# Patient Record
Sex: Female | Born: 1985 | Race: Black or African American | Hispanic: No | Marital: Single | Smoking: Current some day smoker
Health system: Southern US, Community
[De-identification: ages and names within clinical notes are randomized; demographics above are authoritative.]

## PROBLEM LIST (undated history)

## (undated) DIAGNOSIS — Z3490 Encounter for supervision of normal pregnancy, unspecified, unspecified trimester: Secondary | ICD-10-CM

## (undated) DIAGNOSIS — R768 Other specified abnormal immunological findings in serum: Secondary | ICD-10-CM

## (undated) DIAGNOSIS — G35 Multiple sclerosis: Secondary | ICD-10-CM

## (undated) DIAGNOSIS — Z789 Other specified health status: Secondary | ICD-10-CM

## (undated) DIAGNOSIS — F17200 Nicotine dependence, unspecified, uncomplicated: Secondary | ICD-10-CM

## (undated) HISTORY — DX: Other specified abnormal immunological findings in serum: R76.8

## (undated) HISTORY — DX: Multiple sclerosis: G35

## (undated) HISTORY — PX: TONGUE SURGERY: SHX810

## (undated) HISTORY — DX: Nicotine dependence, unspecified, uncomplicated: F17.200

---
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
## 2013-07-20 ENCOUNTER — Encounter (HOSPITAL_COMMUNITY): Payer: Self-pay | Admitting: Emergency Medicine

## 2013-07-20 ENCOUNTER — Emergency Department (HOSPITAL_COMMUNITY)
Admission: EM | Admit: 2013-07-20 | Discharge: 2013-07-20 | Disposition: A | Discharge status: Home / Self Care | Payer: Medicaid Other | Attending: Emergency Medicine | Admitting: Emergency Medicine

## 2013-07-20 DIAGNOSIS — O219 Vomiting of pregnancy, unspecified: Secondary | ICD-10-CM

## 2013-07-20 DIAGNOSIS — R52 Pain, unspecified: Secondary | ICD-10-CM | POA: Insufficient documentation

## 2013-07-20 DIAGNOSIS — O99330 Smoking (tobacco) complicating pregnancy, unspecified trimester: Secondary | ICD-10-CM | POA: Insufficient documentation

## 2013-07-20 DIAGNOSIS — IMO0001 Reserved for inherently not codable concepts without codable children: Secondary | ICD-10-CM | POA: Insufficient documentation

## 2013-07-20 DIAGNOSIS — J069 Acute upper respiratory infection, unspecified: Secondary | ICD-10-CM | POA: Insufficient documentation

## 2013-07-20 DIAGNOSIS — O210 Mild hyperemesis gravidarum: Secondary | ICD-10-CM | POA: Insufficient documentation

## 2013-07-20 DIAGNOSIS — O9989 Other specified diseases and conditions complicating pregnancy, childbirth and the puerperium: Secondary | ICD-10-CM | POA: Insufficient documentation

## 2013-07-20 HISTORY — DX: Encounter for supervision of normal pregnancy, unspecified, unspecified trimester: Z34.90

## 2013-07-20 MED ORDER — PROMETHAZINE HCL 12.5 MG PO TABS
25.0000 mg | ORAL_TABLET | Freq: Once | ORAL | Status: AC
  Administered 2013-07-20: 25 mg via ORAL
  Filled 2013-07-20: qty 2

## 2013-07-20 MED ORDER — PROMETHAZINE HCL 12.5 MG PO TABS: 25.0000 mg | ORAL_TABLET | Freq: Four times a day (QID) | ORAL | Status: DC | PRN

## 2013-07-20 NOTE — ED Notes (Signed)
Pt c/o sore throat and general body aches that started today. Has taken no meds. She took a homoe pregnancy test that was positive. Thinks she may be [redacted] weeks pregnant and has not taken any meds. [facility].

## 2013-07-20 NOTE — ED Notes (Signed)
Pt drinking red soda in triage.

## 2013-07-20 NOTE — Discharge Instructions (Signed)
Upper Respiratory Infection, Adult °An upper respiratory infection (URI) is also sometimes known as the common cold. The upper respiratory tract includes the nose, sinuses, throat, trachea, and bronchi. Bronchi are the airways leading to the lungs. Most people improve within 1 week, but symptoms can last up to 2 weeks. A residual cough may last even longer.  °CAUSES °Many different viruses can infect the tissues lining the upper respiratory tract. The tissues become irritated and inflamed and often become very moist. Mucus production is also common. A cold is contagious. You can easily spread the virus to others by oral contact. This includes kissing, sharing a glass, coughing, or sneezing. Touching your mouth or nose and then touching a surface, which is then touched by another person, can also spread the virus. °SYMPTOMS  °Symptoms typically develop 1 to 3 days after you come in contact with a cold virus. Symptoms vary from person to person. They may include: °· Runny nose. °· Sneezing. °· Nasal congestion. °· Sinus irritation. °· Sore throat. °· Loss of voice (laryngitis). °· Cough. °· Fatigue. °· Muscle aches. °· Loss of appetite. °· Headache. °· Low-grade fever. °DIAGNOSIS  °You might diagnose your own cold based on familiar symptoms, since most people get a cold 2 to 3 times a year. Your caregiver can confirm this based on your exam. Most importantly, your caregiver can check that your symptoms are not due to another disease such as strep throat, sinusitis, pneumonia, asthma, or epiglottitis. Blood tests, throat tests, and X-rays are not necessary to diagnose a common cold, but they may sometimes be helpful in excluding other more serious diseases. Your caregiver will decide if any further tests are required. °RISKS AND COMPLICATIONS  °You may be at risk for a more severe case of the common cold if you smoke cigarettes, have chronic heart disease (such as heart failure) or lung disease (such as asthma), or if  you have a weakened immune system. The very young and very old are also at risk for more serious infections. Bacterial sinusitis, middle ear infections, and bacterial pneumonia can complicate the common cold. The common cold can worsen asthma and chronic obstructive pulmonary disease (COPD). Sometimes, these complications can require emergency medical care and may be life-threatening. °PREVENTION  °The best way to protect against getting a cold is to practice good hygiene. Avoid oral or hand contact with people with cold symptoms. Wash your hands often if contact occurs. There is no clear evidence that vitamin C, vitamin E, echinacea, or exercise reduces the chance of developing a cold. However, it is always recommended to get plenty of rest and practice good nutrition. °TREATMENT  °Treatment is directed at relieving symptoms. There is no cure. Antibiotics are not effective, because the infection is caused by a virus, not by bacteria. Treatment may include: °· Increased fluid intake. Sports drinks offer valuable electrolytes, sugars, and fluids. °· Breathing heated mist or steam (vaporizer or shower). °· Eating chicken soup or other clear broths, and maintaining good nutrition. °· Getting plenty of rest. °· Using gargles or lozenges for comfort. °· Controlling fevers with ibuprofen or acetaminophen as directed by your caregiver. °· Increasing usage of your inhaler if you have asthma. °Zinc gel and zinc lozenges, taken in the first 24 hours of the common cold, can shorten the duration and lessen the severity of symptoms. Pain medicines may help with fever, muscle aches, and throat pain. A variety of non-prescription medicines are available to treat congestion and runny nose. Your caregiver   can make recommendations and may suggest nasal or lung inhalers for other symptoms.  HOME CARE INSTRUCTIONS   Only take over-the-counter or prescription medicines for pain, discomfort, or fever as directed by your  caregiver.  Use a warm mist humidifier or inhale steam from a shower to increase air moisture. This may keep secretions moist and make it easier to breathe.  Drink enough water and fluids to keep your urine clear or pale yellow.  Rest as needed.  Return to work when your temperature has returned to normal or as your caregiver advises. You may need to stay home longer to avoid infecting others. You can also use a face mask and careful hand washing to prevent spread of the virus. SEEK MEDICAL CARE IF:   After the first few days, you feel you are getting worse rather than better.  You need your caregiver's advice about medicines to control symptoms.  You develop chills, worsening shortness of breath, or brown or red sputum. These may be signs of pneumonia.  You develop yellow or brown nasal discharge or pain in the face, especially when you bend forward. These may be signs of sinusitis.  You develop a fever, swollen neck glands, pain with swallowing, or white areas in the back of your throat. These may be signs of strep throat. SEEK IMMEDIATE MEDICAL CARE IF:   You have a fever.  You develop severe or persistent headache, ear pain, sinus pain, or chest pain.  You develop wheezing, a prolonged cough, cough up blood, or have a change in your usual mucus (if you have chronic lung disease).  You develop sore muscles or a stiff neck. Document Released: 12/25/2000 Document Revised: 09/23/2011 Document Reviewed: 11/02/2010 [facility] Patient Information 2014 [place], [place].  Morning Sickness Morning sickness is when you feel sick to your stomach (nauseous) during pregnancy. This nauseous feeling may or may not come with vomiting. It often occurs in the morning but can be a problem any time of day. Morning sickness is most common during the first trimester, but it may continue throughout pregnancy. While morning sickness is unpleasant, it is usually harmless unless you develop severe and  continual vomiting (hyperemesis gravidarum). This condition requires more intense treatment.  CAUSES  The cause of morning sickness is not completely known but seems to be related to normal hormonal changes that occur in pregnancy. RISK FACTORS You are at greater risk if you:  Experienced nausea or vomiting before your pregnancy.  Had morning sickness during a previous pregnancy.  Are pregnant with more than one baby, such as twins. TREATMENT  Do not use any medicines (prescription, over-the-counter, or herbal) for morning sickness without first talking to your health care provider. Your health care provider may prescribe or recommend:  Vitamin B6 supplements.  Anti-nausea medicines.  The herbal medicine ginger. HOME CARE INSTRUCTIONS   Only take over-the-counter or prescription medicines as directed by your health care provider.  Taking multivitamins before getting pregnant can prevent or decrease the severity of morning sickness in most women.   Eat a piece of dry toast or unsalted crackers before getting out of bed in the morning.   Eat five or six small meals a day.   Eat dry and bland foods (rice, baked potato). Foods high in carbohydrates are often helpful.  Do not drink liquids with your meals. Drink liquids between meals.   Avoid greasy, fatty, and spicy foods.   Get someone to cook for you if the smell of any food causes  nausea and vomiting.   If you feel nauseous after taking prenatal vitamins, take the vitamins at night or with a snack.  Snack on protein foods (nuts, yogurt, cheese) between meals if you are hungry.   Eat unsweetened gelatins for desserts.   Wearing an acupressure wristband (worn for sea sickness) may be helpful.   Acupuncture may be helpful.   Do not smoke.   Get a humidifier to keep the air in your house free of odors.   Get plenty of fresh air. SEEK MEDICAL CARE IF:   Your home remedies are not working, and you need  medicine.  You feel dizzy or lightheaded.  You are losing weight. SEEK IMMEDIATE MEDICAL CARE IF:   You have persistent and uncontrolled nausea and vomiting.  You pass out (faint). Document Released: 08/22/2006 Document Revised: 03/03/2013 Document Reviewed: 12/16/2012 [facility] Patient Information 2014 [place], [place].

## 2013-07-20 NOTE — ED Provider Notes (Signed)
Medical screening examination/treatment/procedure(s) were performed by non-physician practitioner and as supervising physician I was immediately available for consultation/collaboration.  EKG Interpretation   None         Shelda JakesScott W. Dorine Duffey, MD 07/20/13 2117

## 2013-07-20 NOTE — ED Provider Notes (Signed)
CSN: [id number]     Arrival date & time 07/20/13  1515 History   First MD Initiated Contact with Patient 07/20/13 1654     Chief Complaint  Patient presents with  . Sore Throat  . Generalized Body Aches   (Consider location/radiation/quality/duration/timing/severity/associated sxs/prior Treatment) HPI Comments: Sherri Woodward is a 28 y.o. Female presenting with a one day history generalized body aches,  Mild dry cough and sore throat (yesterday, but better today).  She also has daily nausea for the past few weeks, is newly pregnant per home pregnancy test and confirmation by the health department and [facility]  Will be [facility] in 6 days.  She is approximately [redacted] weeks pregnant and reports she is eating a drinking, but reduced. She did have one episode of vomiting this am.  She denies abdominal pain.  She has taken no medicines prior to arrival. She has not had a flu vaccine. She is taking prenatal vitamins.     The history is provided by the patient.    Past Medical History  Diagnosis Date  . Pregnancy    Past Surgical History  Procedure Laterality Date  . Cesarean section     No family history on file. History  Substance Use Topics  . Smoking status: Current Every Day Smoker    Types: Cigarettes  . Smokeless tobacco: Not on file  . Alcohol Use: No   OB History   Grav Para Term Preterm Abortions TAB SAB Ect Mult Living   1              Review of Systems  Constitutional: Negative for fever and chills.  HENT: Positive for congestion, rhinorrhea and sore throat. Negative for ear pain, sinus pressure, trouble swallowing and voice change.   Eyes: Negative for discharge.  Respiratory: Positive for cough. Negative for shortness of breath, wheezing and stridor.   Cardiovascular: Negative for chest pain.  Gastrointestinal: Positive for nausea. Negative for abdominal pain.  Genitourinary: Negative.   Musculoskeletal: Positive for myalgias.     Allergies  Review of patient's allergies indicates no known allergies.  Home Medications   Current Outpatient Rx  Name  Route  Sig  Dispense  Refill  . Prenatal Vit-Fe Fumarate-FA (PRENATAL MULTIVITAMIN) TABS tablet   Oral   Take 1 tablet by mouth daily at 12 noon.         . promethazine (PHENERGAN) 12.5 MG tablet   Oral   Take 2 tablets (25 mg total) by mouth every 6 (six) hours as needed for nausea or vomiting.   20 tablet   0    BP 114/61  Pulse 88  Temp(Src) 99.1 F (37.3 C) (Oral)  Resp 20  Ht 5\' 4"  (1.626 m)  Wt 242 lb (109.77 kg)  BMI 41.52 kg/m2  SpO2 100%  LMP 05/28/2013 Physical Exam  Constitutional: She is oriented to person, place, and time. She appears well-developed and well-nourished.  HENT:  Head: Normocephalic and atraumatic.  Right Ear: Tympanic membrane and ear canal normal.  Left Ear: Tympanic membrane and ear canal normal.  Nose: Rhinorrhea present. No mucosal edema.  Mouth/Throat: Uvula is midline, oropharynx is clear and moist and mucous membranes are normal. No oropharyngeal exudate, posterior oropharyngeal edema, posterior oropharyngeal erythema or tonsillar abscesses.  Eyes: Conjunctivae are normal.  Cardiovascular: Normal rate, regular rhythm and normal heart sounds.   Pulmonary/Chest: Effort normal. No respiratory distress. She has no decreased breath sounds. She has no wheezes.  She has no rhonchi. She has no rales.  Abdominal: Soft. There is no tenderness. There is no guarding.  Musculoskeletal: Normal range of motion.  Neurological: She is alert and oriented to person, place, and time.  Skin: Skin is warm and dry. No rash noted.  Psychiatric: She has a normal mood and affect.    ED Course  Procedures (including critical care time) Labs Review Labs Reviewed - No data to display Imaging Review No results found.  EKG Interpretation   None       MDM   1. Acute URI   2. Nausea/vomiting in pregnancy    Pt was prescribed  phenergan for nausea,  Advised rest,  Increased fluid intake.  F/u with ob next week as planned.  Her exam is reassuring,  She is not clinically dehydrated.  Exam c/w viral uri/ possible influenza,  But is afebrile, suspect uri complicated by nausea and fatigue of early pregnancy. Discussed smoking cessation.    Burgess Amor, PA-C 07/20/13 1749

## 2013-07-21 ENCOUNTER — Other Ambulatory Visit: Payer: Self-pay | Admitting: Obstetrics & Gynecology

## 2013-07-21 DIAGNOSIS — O3680X0 Pregnancy with inconclusive fetal viability, not applicable or unspecified: Secondary | ICD-10-CM

## 2013-07-26 ENCOUNTER — Encounter: Payer: Self-pay | Admitting: Women's Health

## 2013-07-26 ENCOUNTER — Other Ambulatory Visit: Payer: Self-pay | Admitting: Obstetrics & Gynecology

## 2013-07-26 ENCOUNTER — Ambulatory Visit (INDEPENDENT_AMBULATORY_CARE_PROVIDER_SITE_OTHER): Payer: Medicaid Other

## 2013-07-26 DIAGNOSIS — O3680X0 Pregnancy with inconclusive fetal viability, not applicable or unspecified: Secondary | ICD-10-CM

## 2013-07-26 DIAGNOSIS — O34219 Maternal care for unspecified type scar from previous cesarean delivery: Secondary | ICD-10-CM

## 2013-07-26 NOTE — Progress Notes (Signed)
U/S(8+3wks)-transabdominal u/s performed, single IUP with +FCA noted, FHR-173 bpm, CRL c/w LMP dates, cx appears long and closed, bilateral adnexa appears WNL, no free fluid noted

## 2013-08-02 ENCOUNTER — Encounter: Payer: Self-pay | Admitting: Women's Health

## 2013-08-10 ENCOUNTER — Ambulatory Visit (INDEPENDENT_AMBULATORY_CARE_PROVIDER_SITE_OTHER): Payer: Medicaid Other | Admitting: Women's Health

## 2013-08-10 ENCOUNTER — Other Ambulatory Visit (HOSPITAL_COMMUNITY)
Admission: RE | Admit: 2013-08-10 | Discharge: 2013-08-10 | Disposition: A | Discharge status: Home / Self Care | Payer: Medicaid Other | Source: Ambulatory Visit | Attending: Obstetrics & Gynecology | Admitting: Obstetrics & Gynecology

## 2013-08-10 ENCOUNTER — Encounter: Payer: Self-pay | Admitting: Women's Health

## 2013-08-10 VITALS — BP 114/60 | Ht 63.0 in | Wt 244.0 lb

## 2013-08-10 DIAGNOSIS — Z98891 History of uterine scar from previous surgery: Secondary | ICD-10-CM

## 2013-08-10 DIAGNOSIS — F1920 Other psychoactive substance dependence, uncomplicated: Secondary | ICD-10-CM

## 2013-08-10 DIAGNOSIS — Z1389 Encounter for screening for other disorder: Secondary | ICD-10-CM

## 2013-08-10 DIAGNOSIS — O99320 Drug use complicating pregnancy, unspecified trimester: Secondary | ICD-10-CM

## 2013-08-10 DIAGNOSIS — F17200 Nicotine dependence, unspecified, uncomplicated: Secondary | ICD-10-CM

## 2013-08-10 DIAGNOSIS — Z113 Encounter for screening for infections with a predominantly sexual mode of transmission: Secondary | ICD-10-CM | POA: Insufficient documentation

## 2013-08-10 DIAGNOSIS — Z331 Pregnant state, incidental: Secondary | ICD-10-CM

## 2013-08-10 DIAGNOSIS — Z01419 Encounter for gynecological examination (general) (routine) without abnormal findings: Secondary | ICD-10-CM | POA: Insufficient documentation

## 2013-08-10 DIAGNOSIS — O34219 Maternal care for unspecified type scar from previous cesarean delivery: Secondary | ICD-10-CM

## 2013-08-10 DIAGNOSIS — Z3480 Encounter for supervision of other normal pregnancy, unspecified trimester: Secondary | ICD-10-CM

## 2013-08-10 HISTORY — DX: Nicotine dependence, unspecified, uncomplicated: F17.200

## 2013-08-10 LAB — POCT URINALYSIS DIPSTICK
GLUCOSE UA: NEGATIVE
KETONES UA: NEGATIVE
Leukocytes, UA: NEGATIVE
Nitrite, UA: NEGATIVE
Protein, UA: NEGATIVE
RBC UA: NEGATIVE

## 2013-08-10 NOTE — Progress Notes (Signed)
  Subjective:    Sherri Woodward is a 28 y.o. G73P1001 African American female at [redacted]w[redacted]d by LMP which correlates exactly w/ [redacted]w[redacted]d u/s, being seen today for her first obstetrical visit.  Her obstetrical history is significant for obesity, smoker and 38wk c/s in 2008 d/t FTP @ 4cm.  Pregnancy history fully reviewed.   Patient reports no complaints. Denies n/v, vb, cramping, uti s/s, abnormal/malodorous vag d/c, or vulvovaginal itching/irritation.  Filed Vitals:   08/10/13 1531  BP: 114/60  Weight: 244 lb (110.678 kg)    HISTORY: OB History  Gravida Para Term Preterm AB SAB TAB Ectopic Multiple Living  2 1 1       1     # Outcome Date GA Lbr Len/2nd Weight Sex Delivery Anes PTL Lv  2 CUR           1 TRM 2008 [redacted]w[redacted]d  6 lb 10 oz (3.005 kg) F LTCS EPI N Y     Comments: FTP @ 4cm     Past Medical History  Diagnosis Date  . Pregnancy   . Smoker 08/10/2013   Past Surgical History  Procedure Laterality Date  . Cesarean section     Family History  Problem Relation Age of Onset  . Hypertension Mother   . Stroke Father   . Heart Problems Father      Exam    Pelvic Exam:    Perineum: Normal Perineum   Vulva: normal   Vagina:  normal mucosa, normal discharge, no palpable nodules   Uterus Normal size/shape/contour for GA     Cervix: normal   Adnexa: Not palpable   Urinary: urethral meatus normal    System:     Skin: normal coloration and turgor, no rashes    Neurologic: oriented, normal mood   Extremities: normal strength, tone, and muscle mass   HEENT PERRLA   Mouth/Teeth mucous membranes moist   Cardiovascular: regular rate and rhythm   Respiratory:  appears well, vitals normal, no respiratory distress, acyanotic, normal RR   Abdomen: soft, non-tender    Thin prep pap smear obtained  FHR: 165 via informal transabdominal u/s   Assessment:    Pregnancy: G2P1001 Patient Active Problem List   Diagnosis Date Noted  . Supervision of other normal pregnancy 08/10/2013     Priority: High  . Previous cesarean section 08/10/2013    Priority: High  . Smoker 08/10/2013    Priority: High      [redacted]w[redacted]d G2P1001 New OB visit Smoker Prev c/s    Plan:     Initial labs drawn Continue prenatal vitamins Problem list reviewed and updated Reviewed n/v relief measures and warning s/s to report Reviewed recommended weight gain based on pre-gravid BMI Encouraged well-balanced diet Genetic Screening discussed Integrated Screen: requested Cystic fibrosis screening discussed requested Ultrasound discussed; fetal survey: requested Follow up in 2 weeks for 1st IT/NT and visit To continue decreasing smoking w/ goal of cessation Discussed option of TOLAC Recommended flu shot CCNC completed  Marge Duncans 08/10/2013 4:34 PM

## 2013-08-10 NOTE — Patient Instructions (Signed)
Nausea & Vomiting  Have saltine crackers or pretzels by your bed and eat a few bites before you raise your head out of bed in the morning  Eat small frequent meals throughout the day instead of large meals  Drink plenty of fluids throughout the day to stay hydrated, just don't drink a lot of fluids with your meals.  This can make your stomach fill up faster making you feel sick  Do not brush your teeth right after you eat  Products with real ginger are good for nausea, like ginger ale and ginger hard candy Make sure it says made with real ginger!  Sucking on sour candy like lemon heads is also good for nausea  If your prenatal vitamins make you nauseated, take them at night so you will sleep through the nausea  If you feel like you need medicine for the nausea & vomiting please let us know  If you are unable to keep any fluids or food down please let us know    Pregnancy - First Trimester During sexual intercourse, millions of sperm go into the vagina. Only 1 sperm will penetrate and fertilize the female egg while it is in the Fallopian tube. One week later, the fertilized egg implants into the wall of the uterus. An embryo begins to develop into a baby. At 6 to 8 weeks, the eyes and face are formed and the heartbeat can be seen on ultrasound. At the end of 12 weeks (first trimester), all the baby's organs are formed. Now that you are pregnant, you will want to do everything you can to have a healthy baby. Two of the most important things are to get good prenatal care and follow your caregiver's instructions. Prenatal care is all the medical care you receive before the baby's birth. It is given to prevent, find, and treat problems during the pregnancy and childbirth. PRENATAL EXAMS  During prenatal visits, your weight, blood pressure, and urine are checked. This is done to make sure you are healthy and progressing normally during the pregnancy.  A pregnant woman should gain 25 to 35 pounds  during the pregnancy. However, if you are overweight or underweight, your caregiver will advise you regarding your weight.  Your caregiver will ask and answer questions for you.  Blood work, cervical cultures, other necessary tests, and a Pap test are done during your prenatal exams. These tests are done to check on your health and the probable health of your baby. Tests are strongly recommended and done for HIV with your permission. This is the virus that causes AIDS. These tests are done because medicines can be given to help prevent your baby from being born with this infection should you have been infected without knowing it. Blood work is also used to find out your blood type, previous infections, and follow your blood levels (hemoglobin).  Low hemoglobin (anemia) is common during pregnancy. Iron and vitamins are given to help prevent this. Later in the pregnancy, blood tests for diabetes will be done along with any other tests if any problems develop.  You may need other tests to make sure you and the baby are doing well. CHANGES DURING THE FIRST TRIMESTER  Your body goes through many changes during pregnancy. They vary from person to person. Talk to your caregiver about changes you notice and are concerned about. Changes can include:  Your menstrual period stops.  The egg and sperm carry the genes that determine what you look like. Genes from you   and your partner are forming a baby. The female genes determine whether the baby is a boy or a girl.  Your body increases in girth and you may feel bloated.  Feeling sick to your stomach (nauseous) and throwing up (vomiting). If the vomiting is uncontrollable, call your caregiver.  Your breasts will begin to enlarge and become tender.  Your nipples may stick out more and become darker.  The need to urinate more. Painful urination may mean you have a bladder infection.  Tiring easily.  Loss of appetite.  Cravings for certain kinds of  food.  At first, you may gain or lose a couple of pounds.  You may have changes in your emotions from day to day (excited to be pregnant or concerned something may go wrong with the pregnancy and baby).  You may have more vivid and strange dreams. HOME CARE INSTRUCTIONS   It is very important to avoid all smoking, alcohol and non-prescribed drugs during your pregnancy. These affect the formation and growth of the baby. Avoid chemicals while pregnant to ensure the delivery of a healthy infant.  Start your prenatal visits by the 12th week of pregnancy. They are usually scheduled monthly at first, then more often in the last 2 months before delivery. Keep your caregiver's appointments. Follow your caregiver's instructions regarding medicine use, blood and lab tests, exercise, and diet.  During pregnancy, you are providing food for you and your baby. Eat regular, well-balanced meals. Choose foods such as meat, fish, milk and other low fat dairy products, vegetables, fruits, and whole-grain breads and cereals. Your caregiver will tell you of the ideal weight gain.  You can help morning sickness by keeping soda crackers at the bedside. Eat a couple before arising in the morning. You may want to use the crackers without salt on them.  Eating 4 to 5 small meals rather than 3 large meals a day also may help the nausea and vomiting.  Drinking liquids between meals instead of during meals also seems to help nausea and vomiting.  A physical sexual relationship may be continued throughout pregnancy if there are no other problems. Problems may be early (premature) leaking of amniotic fluid from the membranes, vaginal bleeding, or belly (abdominal) pain.  Exercise regularly if there are no restrictions. Check with your caregiver or physical therapist if you are unsure of the safety of some of your exercises. Greater weight gain will occur in the last 2 trimesters of pregnancy. Exercising will  help:  Control your weight.  Keep you in shape.  Prepare you for labor and delivery.  Help you lose your pregnancy weight after you deliver your baby.  Wear a good support or jogging bra for breast tenderness during pregnancy. This may help if worn during sleep too.  Ask when prenatal classes are available. Begin classes when they are offered.  Do not use hot tubs, steam rooms, or saunas.  Wear your seat belt when driving. This protects you and your baby if you are in an accident.  Avoid raw meat, uncooked cheese, cat litter boxes, and soil used by cats throughout the pregnancy. These carry germs that can cause birth defects in the baby.  The first trimester is a good time to visit your dentist for your dental health. Getting your teeth cleaned is okay. Use a softer toothbrush and brush gently during pregnancy.  Ask for help if you have financial, counseling, or nutritional needs during pregnancy. Your caregiver will be able to offer counseling for   these needs as well as refer you for other special needs.  Do not take any medicines or herbs unless told by your caregiver.  Inform your caregiver if there is any mental or physical domestic violence.  Make a list of emergency phone numbers of family, friends, hospital, and police and fire departments.  Write down your questions. Take them to your prenatal visit.  Do not douche.  Do not cross your legs.  If you have to stand for long periods of time, rotate you feet or take small steps in a circle.  You may have more vaginal secretions that may require a sanitary pad. Do not use tampons or scented sanitary pads. MEDICINES AND DRUG USE IN PREGNANCY  Take prenatal vitamins as directed. The vitamin should contain 1 milligram of folic acid. Keep all vitamins out of reach of children. Only a couple vitamins or tablets containing iron may be fatal to a baby or young child when ingested.  Avoid use of all medicines, including herbs,  over-the-counter medicines, not prescribed or suggested by your caregiver. Only take over-the-counter or prescription medicines for pain, discomfort, or fever as directed by your caregiver. Do not use aspirin, ibuprofen, or naproxen unless directed by your caregiver.  Let your caregiver also know about herbs you may be using.  Alcohol is related to a number of birth defects. This includes fetal alcohol syndrome. All alcohol, in any form, should be avoided completely. Smoking will cause low birth rate and premature babies.  Street or illegal drugs are very harmful to the baby. They are absolutely forbidden. A baby born to an addicted mother will be addicted at birth. The baby will go through the same withdrawal an adult does.  Let your caregiver know about any medicines that you have to take and for what reason you take them. SEEK MEDICAL CARE IF:  You have any concerns or worries during your pregnancy. It is better to call with your questions if you feel they cannot wait, rather than worry about them. SEEK IMMEDIATE MEDICAL CARE IF:   An unexplained oral temperature above 102 F (38.9 C) develops, or as your caregiver suggests.  You have leaking of fluid from the vagina (birth canal). If leaking membranes are suspected, take your temperature and inform your caregiver of this when you call.  There is vaginal spotting or bleeding. Notify your caregiver of the amount and how many pads are used.  You develop a bad smelling vaginal discharge with a change in the color.  You continue to feel sick to your stomach (nauseated) and have no relief from remedies suggested. You vomit blood or coffee ground-like materials.  You lose more than 2 pounds of weight in 1 week.  You gain more than 2 pounds of weight in 1 week and you notice swelling of your face, hands, feet, or legs.  You gain 5 pounds or more in 1 week (even if you do not have swelling of your hands, face, legs, or feet).  You get  exposed to German measles and have never had them.  You are exposed to fifth disease or chickenpox.  You develop belly (abdominal) pain. Round ligament discomfort is a common non-cancerous (benign) cause of abdominal pain in pregnancy. Your caregiver still must evaluate this.  You develop headache, fever, diarrhea, pain with urination, or shortness of breath.  You fall or are in a car accident or have any kind of trauma.  There is mental or physical violence in your home. Document   Released: 06/25/2001 Document Revised: 03/25/2012 Document Reviewed: 12/27/2008 [employer] Patient Information 2014 [employer].  

## 2013-08-11 ENCOUNTER — Encounter: Payer: Self-pay | Admitting: Women's Health

## 2013-08-11 DIAGNOSIS — F1290 Cannabis use, unspecified, uncomplicated: Secondary | ICD-10-CM | POA: Insufficient documentation

## 2013-08-11 LAB — DRUG SCREEN, URINE, NO CONFIRMATION
AMPHETAMINE SCRN UR: NEGATIVE
BENZODIAZEPINES.: NEGATIVE
Barbiturate Quant, Ur: NEGATIVE
Cocaine Metabolites: NEGATIVE
Creatinine,U: 140.5 mg/dL
Marijuana Metabolite: POSITIVE — AB
Methadone: NEGATIVE
Opiate Screen, Urine: NEGATIVE
Phencyclidine (PCP): NEGATIVE
Propoxyphene: NEGATIVE

## 2013-08-11 LAB — ABO AND RH: RH TYPE: POSITIVE

## 2013-08-11 LAB — CBC
HEMATOCRIT: 36 % (ref 36.0–46.0)
HEMOGLOBIN: 12.1 g/dL (ref 12.0–15.0)
MCH: 31.4 pg (ref 26.0–34.0)
MCHC: 33.6 g/dL (ref 30.0–36.0)
MCV: 93.5 fL (ref 78.0–100.0)
Platelets: 288 10*3/uL (ref 150–400)
RBC: 3.85 MIL/uL — ABNORMAL LOW (ref 3.87–5.11)
RDW: 13.4 % (ref 11.5–15.5)
WBC: 7.9 10*3/uL (ref 4.0–10.5)

## 2013-08-11 LAB — URINALYSIS
Bilirubin Urine: NEGATIVE
GLUCOSE, UA: NEGATIVE mg/dL
Hgb urine dipstick: NEGATIVE
Ketones, ur: NEGATIVE mg/dL
LEUKOCYTES UA: NEGATIVE
NITRITE: NEGATIVE
PH: 7 (ref 5.0–8.0)
Protein, ur: NEGATIVE mg/dL
Specific Gravity, Urine: 1.025 (ref 1.005–1.030)
UROBILINOGEN UA: 1 mg/dL (ref 0.0–1.0)

## 2013-08-11 LAB — HIV ANTIBODY (ROUTINE TESTING W REFLEX): HIV: NONREACTIVE

## 2013-08-11 LAB — HEPATITIS B SURFACE ANTIGEN: Hepatitis B Surface Ag: NEGATIVE

## 2013-08-11 LAB — RPR

## 2013-08-11 LAB — VARICELLA ZOSTER ANTIBODY, IGG

## 2013-08-11 LAB — OXYCODONE SCREEN, UA, RFLX CONFIRM: Oxycodone Screen, Ur: NEGATIVE ng/mL

## 2013-08-11 LAB — ANTIBODY SCREEN: Antibody Screen: NEGATIVE

## 2013-08-11 LAB — RUBELLA SCREEN: Rubella: 2.84 Index — ABNORMAL HIGH (ref <0.90)

## 2013-08-12 LAB — URINE CULTURE

## 2013-08-13 LAB — CYSTIC FIBROSIS DIAGNOSTIC STUDY

## 2013-08-14 ENCOUNTER — Telehealth: Payer: Self-pay | Admitting: Women's Health

## 2013-08-14 ENCOUNTER — Encounter: Payer: Self-pay | Admitting: Women's Health

## 2013-08-14 DIAGNOSIS — O2340 Unspecified infection of urinary tract in pregnancy, unspecified trimester: Secondary | ICD-10-CM

## 2013-08-14 DIAGNOSIS — B951 Streptococcus, group B, as the cause of diseases classified elsewhere: Secondary | ICD-10-CM

## 2013-08-14 MED ORDER — AMPICILLIN 500 MG PO CAPS: 500.0000 mg | ORAL_CAPSULE | Freq: Four times a day (QID) | ORAL | Status: DC

## 2013-08-14 NOTE — Telephone Encounter (Signed)
Left message for pt to return call to notify her of GBS uti, ampicillin 500 QID x 7d rx'd to her pharmacy.   Cheral Marker, CNM, [facility] 08/14/2013 8:42 AM

## 2013-08-14 NOTE — Telephone Encounter (Signed)
Received call from pt, notified her of gbs uti and rx at her pharmacy.   Cheral MarkerKimberly R. Trinidad Petron, CNM, [facility] 08/14/2013 9:21 AM

## 2013-08-24 ENCOUNTER — Encounter: Payer: Self-pay | Admitting: Advanced Practice Midwife

## 2013-08-24 ENCOUNTER — Encounter (INDEPENDENT_AMBULATORY_CARE_PROVIDER_SITE_OTHER): Payer: Self-pay

## 2013-08-24 ENCOUNTER — Ambulatory Visit (INDEPENDENT_AMBULATORY_CARE_PROVIDER_SITE_OTHER): Payer: Medicaid Other | Admitting: Advanced Practice Midwife

## 2013-08-24 ENCOUNTER — Ambulatory Visit (INDEPENDENT_AMBULATORY_CARE_PROVIDER_SITE_OTHER): Payer: Medicaid Other

## 2013-08-24 ENCOUNTER — Other Ambulatory Visit: Payer: Self-pay | Admitting: Women's Health

## 2013-08-24 ENCOUNTER — Other Ambulatory Visit: Payer: Self-pay | Admitting: Advanced Practice Midwife

## 2013-08-24 VITALS — BP 100/68 | Wt 250.0 lb

## 2013-08-24 DIAGNOSIS — Z36 Encounter for antenatal screening of mother: Secondary | ICD-10-CM

## 2013-08-24 DIAGNOSIS — O34219 Maternal care for unspecified type scar from previous cesarean delivery: Secondary | ICD-10-CM

## 2013-08-24 DIAGNOSIS — Z331 Pregnant state, incidental: Secondary | ICD-10-CM

## 2013-08-24 DIAGNOSIS — Z3480 Encounter for supervision of other normal pregnancy, unspecified trimester: Secondary | ICD-10-CM

## 2013-08-24 DIAGNOSIS — Z1389 Encounter for screening for other disorder: Secondary | ICD-10-CM

## 2013-08-24 DIAGNOSIS — O358XX0 Maternal care for other (suspected) fetal abnormality and damage, not applicable or unspecified: Secondary | ICD-10-CM

## 2013-08-24 DIAGNOSIS — F1920 Other psychoactive substance dependence, uncomplicated: Secondary | ICD-10-CM

## 2013-08-24 DIAGNOSIS — F1290 Cannabis use, unspecified, uncomplicated: Secondary | ICD-10-CM

## 2013-08-24 DIAGNOSIS — O99320 Drug use complicating pregnancy, unspecified trimester: Secondary | ICD-10-CM

## 2013-08-24 LAB — POCT URINALYSIS DIPSTICK
Glucose, UA: NEGATIVE
KETONES UA: NEGATIVE
Leukocytes, UA: NEGATIVE
NITRITE UA: NEGATIVE
Protein, UA: NEGATIVE
RBC UA: NEGATIVE

## 2013-08-24 NOTE — Progress Notes (Signed)
U/S(12+4wks)-single IUP with +FCA noted, FHR- 158 bpm, cx appears closed (3.7cm), bilateral adnexa wnl, CRL c/w dates, NB present, NT-1.32mm, posterior Gr 0 placenta

## 2013-08-24 NOTE — Progress Notes (Signed)
Had NT/IT today.  Counseled about marijuana and weight.   Dry skin on breasts. Hydrocortisione.  Routine questions about pregnancy answered.  F/U in 4 weeks for 2nd IT/Low-risk ob appt .

## 2013-08-28 LAB — MATERNAL SCREEN, INTEGRATED #1

## 2013-09-21 ENCOUNTER — Ambulatory Visit (INDEPENDENT_AMBULATORY_CARE_PROVIDER_SITE_OTHER): Payer: Medicaid Other | Admitting: Obstetrics & Gynecology

## 2013-09-21 ENCOUNTER — Encounter: Payer: Self-pay | Admitting: Obstetrics & Gynecology

## 2013-09-21 VITALS — BP 120/70 | Wt 246.0 lb

## 2013-09-21 DIAGNOSIS — F1920 Other psychoactive substance dependence, uncomplicated: Secondary | ICD-10-CM

## 2013-09-21 DIAGNOSIS — O99320 Drug use complicating pregnancy, unspecified trimester: Secondary | ICD-10-CM

## 2013-09-21 DIAGNOSIS — Z1389 Encounter for screening for other disorder: Secondary | ICD-10-CM

## 2013-09-21 DIAGNOSIS — O358XX0 Maternal care for other (suspected) fetal abnormality and damage, not applicable or unspecified: Secondary | ICD-10-CM

## 2013-09-21 DIAGNOSIS — Z3480 Encounter for supervision of other normal pregnancy, unspecified trimester: Secondary | ICD-10-CM

## 2013-09-21 DIAGNOSIS — O34219 Maternal care for unspecified type scar from previous cesarean delivery: Secondary | ICD-10-CM

## 2013-09-21 DIAGNOSIS — Z331 Pregnant state, incidental: Secondary | ICD-10-CM

## 2013-09-21 LAB — POCT URINALYSIS DIPSTICK
Blood, UA: NEGATIVE
GLUCOSE UA: NEGATIVE
KETONES UA: NEGATIVE
Leukocytes, UA: NEGATIVE
Nitrite, UA: NEGATIVE

## 2013-09-21 MED ORDER — PRENATAL MULTIVITAMIN CH: ORAL_TABLET | ORAL | Status: DC

## 2013-09-21 NOTE — Progress Notes (Signed)
BP weight and urine results all reviewed and noted. Patient reports good fetal movement, denies any bleeding and no rupture of membranes symptoms or regular contractions. Patient is without complaints. All questions were answered.  2nd IT today 20 week sonogram 4 weeks

## 2013-09-25 LAB — MATERNAL SCREEN, INTEGRATED #2
AFP MOM MAT SCREEN: 1.59
AFP, SERUM MAT SCREEN: 50 ng/mL
Age risk Down Syndrome: 1:1000 {titer}
CALCULATED GESTATIONAL AGE MAT SCREEN: 17
Crown Rump Length: 69.4 mm
ESTRIOL FREE MAT SCREEN: 0.77 ng/mL
Estriol Mom: 0.87
HCG, MOM MAT SCREEN: 0.53
HCG, SERUM MAT SCREEN: 14.3 [IU]/mL
INHIBIN A DIMERIC MAT SCREEN: 332 pg/mL
Inhibin A MoM: 2.48
NT MoM: 1.1
NUMBER OF FETUSES MAT SCREEN 2: 1
Nuchal Translucency: 1.69 mm
PAPP-A MoM: 0.72
PAPP-A: 713 ng/mL
Rish for ONTD: 1:2400 {titer}

## 2013-10-19 ENCOUNTER — Ambulatory Visit (INDEPENDENT_AMBULATORY_CARE_PROVIDER_SITE_OTHER): Payer: Medicaid Other | Admitting: Adult Health

## 2013-10-19 ENCOUNTER — Encounter: Payer: Self-pay | Admitting: Adult Health

## 2013-10-19 ENCOUNTER — Ambulatory Visit (INDEPENDENT_AMBULATORY_CARE_PROVIDER_SITE_OTHER): Payer: Medicaid Other

## 2013-10-19 ENCOUNTER — Other Ambulatory Visit: Payer: Self-pay | Admitting: Obstetrics & Gynecology

## 2013-10-19 VITALS — BP 110/60 | Wt 248.0 lb

## 2013-10-19 DIAGNOSIS — Z3480 Encounter for supervision of other normal pregnancy, unspecified trimester: Secondary | ICD-10-CM

## 2013-10-19 DIAGNOSIS — Z1389 Encounter for screening for other disorder: Secondary | ICD-10-CM

## 2013-10-19 DIAGNOSIS — Z363 Encounter for antenatal screening for malformations: Secondary | ICD-10-CM

## 2013-10-19 DIAGNOSIS — Z331 Pregnant state, incidental: Secondary | ICD-10-CM

## 2013-10-19 DIAGNOSIS — O99330 Smoking (tobacco) complicating pregnancy, unspecified trimester: Secondary | ICD-10-CM

## 2013-10-19 DIAGNOSIS — F1920 Other psychoactive substance dependence, uncomplicated: Secondary | ICD-10-CM

## 2013-10-19 DIAGNOSIS — O34219 Maternal care for unspecified type scar from previous cesarean delivery: Secondary | ICD-10-CM

## 2013-10-19 DIAGNOSIS — O99320 Drug use complicating pregnancy, unspecified trimester: Secondary | ICD-10-CM

## 2013-10-19 LAB — POCT URINALYSIS DIPSTICK
GLUCOSE UA: NEGATIVE
KETONES UA: NEGATIVE
LEUKOCYTES UA: NEGATIVE
Nitrite, UA: NEGATIVE
Protein, UA: NEGATIVE
RBC UA: NEGATIVE

## 2013-10-19 NOTE — Progress Notes (Signed)
Had US and its a boy, wants to try for VBAC and wants in hospital tubal and wants baby circumcised .No bleeding, no pain, counseled regarding THC use, will return in 4 weeks for OB visit.Tubal papers signed.review handouts on VBAC and tubal.

## 2013-10-19 NOTE — Progress Notes (Signed)
Pt denies any problems or concerns at this time.  

## 2013-10-19 NOTE — Patient Instructions (Signed)
Vaginal Birth After Cesarean Delivery Vaginal birth after cesarean delivery (VBAC) is giving birth vaginally after previously delivering a baby by a cesarean. In the past, if a woman had a cesarean delivery, all births afterwards would be done by cesarean delivery. This is no longer true. It can be safe for the mother to try a vaginal delivery after having a cesarean delivery.  It is important to discuss VBAC with your health care provider early in the pregnancy so you can understand the risks, benefits, and options. It will give you time to decide what is best in your particular case. The final decision about whether to have a VBAC or repeat cesarean delivery should be between you and your health care provider. Any changes in your health or your baby's health during your pregnancy may make it necessary to change your initial decision about VBAC.  WOMEN WHO PLAN TO HAVE A VBAC SHOULD CHECK WITH THEIR HEALTH CARE PROVIDER TO BE SURE THAT:  The previous cesarean delivery was done with a low transverse uterine cut (incision) (not a vertical classical incision).   The birth canal is big enough for the baby.   There were no other operations on the uterus.   An electronic fetal monitor (EFM) will be on at all times during labor.   An operating room will be available and ready in case an emergency cesarean delivery is needed.   A health care provider and surgical nursing staff will be available at all times during labor to be ready to do an emergency delivery cesarean if necessary.   An anesthesiologist will be present in case an emergency cesarean delivery is needed.   The nursery is prepared and has adequate personnel and necessary equipment available to care for the baby in case of an emergency cesarean delivery. BENEFITS OF VBAC  Shorter stay in the hospital.   Avoidance of risks associated with cesarean delivery, such as:  Surgical complications, such as opening of the incision or  hernia in the incision.  Injury to other organs.  Fever. This can occur if an infection develops after surgery. It can also occur as a reaction to the medicine given to make you numb during the surgery.  Less blood loss and need for blood transfusions.  Lower risk of blood clots and infection.  Shorter recovery.   Decreased risk for having to remove the uterus (hysterectomy).   Decreased risk for the placenta to completely or partially cover the opening of the uterus (placenta previa) with a future pregnancy.   Decrease risk in future labor and delivery. RISKS OF A VBAC  Tearing (rupture) of the uterus. This is occurs in less than 1% of VBACs. The risk of this happening is higher if:  Steps are taken to begin the labor process (induce labor) or stimulate or strengthen contractions (augment labor).   Medicine is used to soften (ripen) the cervix.  Having to remove the uterus (hysterectomy) if it ruptures. VBAC SHOULD NOT BE DONE IF:  The previous cesarean delivery was done with a vertical (classical) or T-shaped incision or you do not know what kind of incision was made.   You had a ruptured uterus.   You have had certain types of surgery on your uterus, such as removal of uterine fibroids. Ask your health care provider about other types of surgeries that prevent you from having a VBAC.  You have certain medical or childbirth (obstetrical) problems.   There are problems with the baby.   You   have had two previous cesarean deliveries and no vaginal deliveries. OTHER FACTS TO KNOW ABOUT VBAC:  It is safe to have an epidural anesthetic with VBAC.   It is safe to turn the baby from a breech position (attempt an external cephalic version).   It is safe to try a VBAC with twins.   VBAC may not be successful if your baby weights 8.8 lb (4 kg) or more. However, weight predictions are not always accurate and should not be used alone to decide if VBAC is right for  you.  There is an increased failure rate if the time between the cesarean delivery and VBAC is less than 19 months.   Your health care provider may advise against a VBAC if you have preeclampsia (high blood pressure, protein in the urine, and swelling of face and extremities).   VBAC is often successful if you previously gave birth vaginally.   VBAC is often successful when the labor starts spontaneously before the due date.   Delivering a baby through a VBAC is similar to having a normal spontaneous vaginal delivery. Document Released: 12/22/2006 Document Revised: 04/21/2013 Document Reviewed: 01/28/2013 [facility] Patient Information 2014 [place], [place]. Sterilization Information, Female Female sterilization is a procedure to permanently prevent pregnancy. There are different ways to perform sterilization, but all either block or close the fallopian tubes so that your eggs cannot reach your uterus. If your egg cannot reach your uterus, sperm cannot fertilize the egg, and you cannot get pregnant.  Sterilization is performed by a surgical procedure. Sometimes these procedures are performed in a hospital while a patient is asleep. Sometimes they can be done in a clinic setting with the patient awake. The fallopian tubes can be surgically cut, tied, or sealed through a procedure called tubal ligation. The fallopian tubes can also be closed with clips or rings. Sterilization can also be done by placing a tiny coil into each fallopian tube, which causes scar tissue to grow inside the tube. The scar tissue then blocks the tubes.  Discuss sterilization with your caregiver to answer any concerns you or your partner may have. You may want to ask what type of sterilization your caregiver performs. Some caregivers may not perform all the various options. Sterilization is permanent and should only be done if you are sure you do not want children or do not want any more children. Having a sterilization  reversed may not be successful.  STERILIZATION PROCEDURES  Laparoscopic sterilization. This is a surgical method performed at a time other than right after childbirth. Two incisions are made in the lower abdomen. A thin, lighted tube (laparoscope) is inserted into one of the incisions and is used to perform the procedure. The fallopian tubes are closed with a ring or a clip. An instrument that uses heat could be used to seal the tubes closed (electrocautery).   Mini-laparotomy. This is a surgical method done 1 or 2 days after giving birth. Typically, a small incision is made just below the belly button (umbilicus) and the fallopian tubes are exposed. The tubes can then be sealed, tied, or cut.   Hysteroscopic sterilization. This is performed at a time other than right after childbirth. A tiny, spring-like coil is inserted through the cervix and uterus and placed into the fallopian tubes. The coil causes scaring and blocks the tubes. Other forms of contraception should be used for 3 months after the procedure to allow the scar tissue to form completely. Additionally, it is required hysterosalpingography be done  3 months later to ensure that the procedure was successful. Hysterosalpingography is a procedure that uses X-rays to look at your uterus and fallopian tubes after a material to make them show up better has been inserted. IS STERILIZATION SAFE? Sterilization is considered safe with very rare complications. Risks depend on the type of procedure you have. As with any surgical procedure, there are risks. Some risks of sterilization by any means include:   Bleeding.  Infection.  Reaction to anesthesia medicine.  Injury to surrounding organs. Risks specific to having hysteroscopic coils placed include:  The coils may not be placed correctly the first time.   The coils may move out of place.   The tubes may not get completely blocked after 3 months.   Injury to surrounding organs when  placing the coil.  HOW EFFECTIVE IS FEMALE STERILIZATION? Sterilization is nearly 100% effective, but it can fail. Depending on the type of sterilization, the rate of failure can be as high as 3%. After hysteroscopic sterilization with placement of fallopian tube coils, you will need back-up birth control for 3 months after the procedure. Sterilization is effective for a lifetime.  BENEFITS OF STERILIZATION  It does not affect your hormones, and therefore will not affect your menstrual periods, sexual desire, or performance.   It is effective for a lifetime.   It is safe.   You do not need to worry about getting pregnant. Keep in mind that if you had the hysteroscopic placement procedure, you must wait 3 months after the procedure (or until your caregiver confirms) before pregnancy is not considered possible.   There are no side effects unlike other types of birth control (contraception).  DRAWBACKS OF STERILIZATION  You must be sure you do not want children or any more children. The procedure is permanent.   It does not provide protection against sexually transmitted infections (STIs).   The tubes can grow back together. If this happens, there is a risk of pregnancy. There is also an increased risk (50%) of pregnancy being an ectopic pregnancy. This is a pregnancy that happens outside of the uterus. Document Released: 12/18/2007 Document Revised: 12/31/2011 Document Reviewed: 10/17/2011 [facility] Patient Information 2014 [place], [place]. Follow up in 4 weeks

## 2013-10-19 NOTE — Progress Notes (Signed)
U/S(20+4wks)-active fetus, meas c/w dates, fluid wnl, posterior Gr 0 placenta, cx appears closed (4.3cm), FHR- 151 bpm, female fetus, no obvious abnl noted although sub-optimal views of fetus

## 2013-11-17 ENCOUNTER — Ambulatory Visit (INDEPENDENT_AMBULATORY_CARE_PROVIDER_SITE_OTHER): Payer: Medicaid Other | Admitting: Women's Health

## 2013-11-17 ENCOUNTER — Encounter: Payer: Self-pay | Admitting: Women's Health

## 2013-11-17 VITALS — BP 110/68 | Wt 248.0 lb

## 2013-11-17 DIAGNOSIS — Z1389 Encounter for screening for other disorder: Secondary | ICD-10-CM

## 2013-11-17 DIAGNOSIS — O34219 Maternal care for unspecified type scar from previous cesarean delivery: Secondary | ICD-10-CM

## 2013-11-17 DIAGNOSIS — F1290 Cannabis use, unspecified, uncomplicated: Secondary | ICD-10-CM

## 2013-11-17 DIAGNOSIS — F1920 Other psychoactive substance dependence, uncomplicated: Secondary | ICD-10-CM

## 2013-11-17 DIAGNOSIS — B951 Streptococcus, group B, as the cause of diseases classified elsewhere: Secondary | ICD-10-CM

## 2013-11-17 DIAGNOSIS — Z3480 Encounter for supervision of other normal pregnancy, unspecified trimester: Secondary | ICD-10-CM

## 2013-11-17 DIAGNOSIS — O99320 Drug use complicating pregnancy, unspecified trimester: Secondary | ICD-10-CM

## 2013-11-17 DIAGNOSIS — Z98891 History of uterine scar from previous surgery: Secondary | ICD-10-CM

## 2013-11-17 DIAGNOSIS — O99330 Smoking (tobacco) complicating pregnancy, unspecified trimester: Secondary | ICD-10-CM

## 2013-11-17 DIAGNOSIS — Z331 Pregnant state, incidental: Secondary | ICD-10-CM

## 2013-11-17 DIAGNOSIS — O2340 Unspecified infection of urinary tract in pregnancy, unspecified trimester: Secondary | ICD-10-CM

## 2013-11-17 DIAGNOSIS — F17200 Nicotine dependence, unspecified, uncomplicated: Secondary | ICD-10-CM

## 2013-11-17 LAB — POCT URINALYSIS DIPSTICK
Blood, UA: NEGATIVE
Glucose, UA: NEGATIVE
NITRITE UA: NEGATIVE
Protein, UA: NEGATIVE

## 2013-11-17 NOTE — Progress Notes (Signed)
Reports good fm. Denies uc's, lof, vb, uti s/s.  HA's, hasn't tried any meds. Recommended apap, caffeine if needed. Prev c/s, had been thinking about vbac, but is wanting btl- discussed high incidence of regret <26yo, discussed [facility]- interested in [place]- brochure given to take home and review. If decides for LARC, wants vbac. Undecided about circumcision d/t money, knows we have a payment plan. Still smoking 1/2ppd, helps 'keep me sane'. Advised cessation, discussed risks of smoking during pregnancy and pp. Pt interested in cutting back/quitting. [facility] referral done. Reviewed ptl s/s, fm.  All questions answered. F/U in 4wks for pn2 and visit.  No THC in 4wks, will recheck uds today.

## 2013-11-17 NOTE — Patient Instructions (Signed)
You will have your sugar test next visit.  Please do not eat or drink anything after midnight the night before you come, not even water.  You will be here for at least two hours.     Pregnancy and Smoking Smoking during pregnancy is very unhealthy for the mother and the developing fetus. The addictive drug in cigarettes (nicotine), carbon monoxide, and many other poisons are inhaled from a cigarette and are carried through your bloodstream to your fetus. Cigarette smoke contains more than 2,500 chemicals. It is not known which of these chemicals are harmful to the developing fetus. However, both nicotine and carbon monoxide play a role in causing health problems in pregnancy. Effects on the fetus of smoking during pregnancy:  Decrease in blood flow and oxygen to the uterus, placenta, and your fetus.  Increased heart rate of the fetus.  Slowing of your fetus's growth in the uterus (intrauterine growth retardation).  Placental problems. Placenta may partially cover or completely cover the opening to the cervix (placenta previa), or the placenta may partially or completely separate from the uterus (placental abruption).  Increase risk of pregnancy outside of the uterus (tubal pregnancy).  Premature rupture membranes, causing the sac that holds the fetus to break too early, resulting in premature birth and increased health risks to the newborn.  Increased risk of birth defects, including heart defects.  Increased risk of miscarriage. Newborns born to women who smoke during pregnancy:  Are more likely to be born too early (prematurely).  Are more likely to be at a low birth weight.  Are at risk for serious health problems, chronic or lifelong disabilities (cerebral palsy, mental retardation, learning problems), and possibly even death  Are at risk of Sudden Infant Death Syndrome (SIDS).  Have higher rates of miscarriage and stillbirth.  Have more lung and breathing (respiratory)  problems. Long-term effects on a child's behavior: Some of the following trends are seen with children of smoking mothers:  Increased risk for drug abuse, behavior, and conduct disorders.  Increased risk for smoking in adolescent girls.  Increased risk for negative behavior in 2-year-olds.  Increase risk for asthma, colic, and childhood obesity, which can lead to diabetes.  Increased risk for finger and toe disorders. Resources to stop smoking during pregnancy:  Counseling.  Psychological treatment.  Acupuncture.  Family intervention.  Hypnosis.  Medicines that are safe to take during pregnancy. Nicotine supplements have not been studied enough. They should only be considered when all other methods fail.  Telephone QUIT lines. Smoking and Breastfeeding:  Nicotine gets passed to the infant through a mother's breastmilk. This can cause nausea, colic, cramping, and diarrhea in the infant.  Smoking may reduce milk supply and interfere with the let-down response.  Even formula-fed infants of mothers who smoke have nicotine and cotinine (nicotine by-product) in their urine. Other resources to help stop smoking:  American Cancer Society: www.cancer.org  American Heart Association: www.americanheart.org  National Cancer Institute: www.cancer.gov  Smoke Free Families: www.smokefreefamilies.[address] [place] Line): 423-162-7788 START Document Released: 11/12/2004 Document Revised: 09/23/2011 Document Reviewed: 04/12/2009 [facility] Patient Information 2014 [place], [place].  Second Trimester of Pregnancy The second trimester is from week 13 through week 28, months 4 through 6. The second trimester is often a time when you feel your best. Your body has also adjusted to being pregnant, and you begin to feel better physically. Usually, morning sickness has lessened or quit completely, you may have more energy, and you may have an increase in  appetite. The second trimester is also a  time when the fetus is growing rapidly. At the end of the sixth month, the fetus is about 9 inches long and weighs about 1 pounds. You will likely begin to feel the baby move (quickening) between 18 and 20 weeks of the pregnancy. BODY CHANGES Your body goes through many changes during pregnancy. The changes vary from woman to woman.   Your weight will continue to increase. You will notice your lower abdomen bulging out.  You may begin to get stretch marks on your hips, abdomen, and breasts.  You may develop headaches that can be relieved by medicines approved by your caregiver.  You may urinate more often because the fetus is pressing on your bladder.  You may develop or continue to have heartburn as a result of your pregnancy.  You may develop constipation because certain hormones are causing the muscles that push waste through your intestines to slow down.  You may develop hemorrhoids or swollen, bulging veins (varicose veins).  You may have back pain because of the weight gain and pregnancy hormones relaxing your joints between the bones in your pelvis and as a result of a shift in weight and the muscles that support your balance.  Your breasts will continue to grow and be tender.  Your gums may bleed and may be sensitive to brushing and flossing.  Dark spots or blotches (chloasma, mask of pregnancy) may develop on your face. This will likely fade after the baby is born.  A dark line from your belly button to the pubic area (linea nigra) may appear. This will likely fade after the baby is born. WHAT TO EXPECT AT YOUR PRENATAL VISITS During a routine prenatal visit:  You will be weighed to make sure you and the fetus are growing normally.  Your blood pressure will be taken.  Your abdomen will be measured to track your baby's growth.  The fetal heartbeat will be listened to.  Any test results from the previous visit will be discussed. Your caregiver may ask you:  How you  are feeling.  If you are feeling the baby move.  If you have had any abnormal symptoms, such as leaking fluid, bleeding, severe headaches, or abdominal cramping.  If you have any questions. Other tests that may be performed during your second trimester include:  Blood tests that check for:  Low iron levels (anemia).  Gestational diabetes (between 24 and 28 weeks).  Rh antibodies.  Urine tests to check for infections, diabetes, or protein in the urine.  An ultrasound to confirm the proper growth and development of the baby.  An amniocentesis to check for possible genetic problems.  Fetal screens for spina bifida and Down syndrome. HOME CARE INSTRUCTIONS   Avoid all smoking, herbs, alcohol, and unprescribed drugs. These chemicals affect the formation and growth of the baby.  Follow your caregiver's instructions regarding medicine use. There are medicines that are either safe or unsafe to take during pregnancy.  Exercise only as directed by your caregiver. Experiencing uterine cramps is a good sign to stop exercising.  Continue to eat regular, healthy meals.  Wear a good support bra for breast tenderness.  Do not use hot tubs, steam rooms, or saunas.  Wear your seat belt at all times when driving.  Avoid raw meat, uncooked cheese, cat litter boxes, and soil used by cats. These carry germs that can cause birth defects in the baby.  Take your prenatal vitamins.  Try taking  a stool softener (if your caregiver approves) if you develop constipation. Eat more high-fiber foods, such as fresh vegetables or fruit and whole grains. Drink plenty of fluids to keep your urine clear or pale yellow.  Take warm sitz baths to soothe any pain or discomfort caused by hemorrhoids. Use hemorrhoid cream if your caregiver approves.  If you develop varicose veins, wear support hose. Elevate your feet for 15 minutes, 3 4 times a day. Limit salt in your diet.  Avoid heavy lifting, wear low heel  shoes, and practice good posture.  Rest with your legs elevated if you have leg cramps or low back pain.  Visit your dentist if you have not gone yet during your pregnancy. Use a soft toothbrush to brush your teeth and be gentle when you floss.  A sexual relationship may be continued unless your caregiver directs you otherwise.  Continue to go to all your prenatal visits as directed by your caregiver. SEEK MEDICAL CARE IF:   You have dizziness.  You have mild pelvic cramps, pelvic pressure, or nagging pain in the abdominal area.  You have persistent nausea, vomiting, or diarrhea.  You have a bad smelling vaginal discharge.  You have pain with urination. SEEK IMMEDIATE MEDICAL CARE IF:   You have a fever.  You are leaking fluid from your vagina.  You have spotting or bleeding from your vagina.  You have severe abdominal cramping or pain.  You have rapid weight gain or loss.  You have shortness of breath with chest pain.  You notice sudden or extreme swelling of your face, hands, ankles, feet, or legs.  You have not felt your baby move in over an hour.  You have severe headaches that do not go away with medicine.  You have vision changes. Document Released: 06/25/2001 Document Revised: 03/03/2013 Document Reviewed: 09/01/2012 [facility] Patient Information 2014 [place], [place].

## 2013-11-18 ENCOUNTER — Telehealth: Payer: Self-pay | Admitting: Obstetrics and Gynecology

## 2013-11-18 DIAGNOSIS — O2340 Unspecified infection of urinary tract in pregnancy, unspecified trimester: Principal | ICD-10-CM

## 2013-11-18 DIAGNOSIS — B951 Streptococcus, group B, as the cause of diseases classified elsewhere: Secondary | ICD-10-CM

## 2013-11-18 DIAGNOSIS — Z98891 History of uterine scar from previous surgery: Secondary | ICD-10-CM

## 2013-11-18 DIAGNOSIS — Z3480 Encounter for supervision of other normal pregnancy, unspecified trimester: Secondary | ICD-10-CM

## 2013-11-18 LAB — DRUG SCREEN, URINE, NO CONFIRMATION
AMPHETAMINE SCRN UR: NEGATIVE
BARBITURATE QUANT UR: NEGATIVE
Benzodiazepines.: NEGATIVE
COCAINE METABOLITES: NEGATIVE
Creatinine,U: 181.6 mg/dL
MARIJUANA METABOLITE: POSITIVE — AB
METHADONE: NEGATIVE
Opiate Screen, Urine: NEGATIVE
Phencyclidine (PCP): NEGATIVE
Propoxyphene: NEGATIVE

## 2013-11-18 NOTE — Telephone Encounter (Signed)
Pt called and needed a note that stated how far along she is and when her EDD is. I spoke with the pt and pt is aware that the note will be up front for her to pick up.

## 2013-11-22 ENCOUNTER — Encounter: Payer: Self-pay | Admitting: Women's Health

## 2013-12-15 ENCOUNTER — Ambulatory Visit (INDEPENDENT_AMBULATORY_CARE_PROVIDER_SITE_OTHER): Payer: Self-pay | Admitting: Women's Health

## 2013-12-15 ENCOUNTER — Encounter: Payer: Self-pay | Admitting: Women's Health

## 2013-12-15 ENCOUNTER — Other Ambulatory Visit: Payer: Medicaid Other

## 2013-12-15 VITALS — BP 100/56 | Wt 249.0 lb

## 2013-12-15 DIAGNOSIS — Z1389 Encounter for screening for other disorder: Secondary | ICD-10-CM

## 2013-12-15 DIAGNOSIS — Z331 Pregnant state, incidental: Secondary | ICD-10-CM

## 2013-12-15 DIAGNOSIS — Z3480 Encounter for supervision of other normal pregnancy, unspecified trimester: Secondary | ICD-10-CM

## 2013-12-15 DIAGNOSIS — F1290 Cannabis use, unspecified, uncomplicated: Secondary | ICD-10-CM

## 2013-12-15 LAB — POCT URINALYSIS DIPSTICK
Blood, UA: NEGATIVE
Glucose, UA: NEGATIVE
Ketones, UA: NEGATIVE
LEUKOCYTES UA: NEGATIVE
Nitrite, UA: NEGATIVE
Protein, UA: NEGATIVE

## 2013-12-15 LAB — CBC
HEMATOCRIT: 31.7 % — AB (ref 36.0–46.0)
HEMOGLOBIN: 11 g/dL — AB (ref 12.0–15.0)
MCH: 32.1 pg (ref 26.0–34.0)
MCHC: 34.7 g/dL (ref 30.0–36.0)
MCV: 92.4 fL (ref 78.0–100.0)
Platelets: 266 10*3/uL (ref 150–400)
RBC: 3.43 MIL/uL — AB (ref 3.87–5.11)
RDW: 13.7 % (ref 11.5–15.5)
WBC: 8.8 10*3/uL (ref 4.0–10.5)

## 2013-12-15 NOTE — Addendum Note (Signed)
Addended by: Shawna ClampBOOKER, KIMBERLY R on: 12/15/2013 10:15 AM   Modules accepted: Orders

## 2013-12-15 NOTE — Progress Notes (Signed)
Reports good fm. Denies uc's, lof, vb, uti s/s.  No complaints.  Has cut back some on smoking, 1p lasts 2-3 days now. Wants TOLAC, reviewed r/b, consent signed. Doesn't want BTL anymore, wants Mirena. Reviewed ptl s/s, fkc.  All questions answered. F/U in 4wks for visit. PN2 today.

## 2013-12-15 NOTE — Patient Instructions (Signed)
Third Trimester of Pregnancy  The third trimester is from week 29 through week 42, months 7 through 9. The third trimester is a time when the fetus is growing rapidly. At the end of the ninth month, the fetus is about 20 inches in length and weighs 6 10 pounds.   BODY CHANGES  Your body goes through many changes during pregnancy. The changes vary from woman to woman.    Your weight will continue to increase. You can expect to gain 25 35 pounds (11 16 kg) by the end of the pregnancy.   You may begin to get stretch marks on your hips, abdomen, and breasts.   You may urinate more often because the fetus is moving lower into your pelvis and pressing on your bladder.   You may develop or continue to have heartburn as a result of your pregnancy.   You may develop constipation because certain hormones are causing the muscles that push waste through your intestines to slow down.   You may develop hemorrhoids or swollen, bulging veins (varicose veins).   You may have pelvic pain because of the weight gain and pregnancy hormones relaxing your joints between the bones in your pelvis. Back aches may result from over exertion of the muscles supporting your posture.   Your breasts will continue to grow and be tender. A yellow discharge may leak from your breasts called colostrum.   Your belly button may stick out.   You may feel short of breath because of your expanding uterus.   You may notice the fetus "dropping," or moving lower in your abdomen.   You may have a bloody mucus discharge. This usually occurs a few days to a week before labor begins.   Your cervix becomes thin and soft (effaced) near your due date.  WHAT TO EXPECT AT YOUR PRENATAL EXAMS   You will have prenatal exams every 2 weeks until week 36. Then, you will have weekly prenatal exams. During a routine prenatal visit:   You will be weighed to make sure you and the fetus are growing normally.   Your blood pressure is taken.   Your abdomen will be  measured to track your baby's growth.   The fetal heartbeat will be listened to.   Any test results from the previous visit will be discussed.   You may have a cervical check near your due date to see if you have effaced.  At around 36 weeks, your caregiver will check your cervix. At the same time, your caregiver will also perform a test on the secretions of the vaginal tissue. This test is to determine if a type of bacteria, Group B streptococcus, is present. Your caregiver will explain this further.  Your caregiver may ask you:   What your birth plan is.   How you are feeling.   If you are feeling the baby move.   If you have had any abnormal symptoms, such as leaking fluid, bleeding, severe headaches, or abdominal cramping.   If you have any questions.  Other tests or screenings that may be performed during your third trimester include:   Blood tests that check for low iron levels (anemia).   Fetal testing to check the health, activity level, and growth of the fetus. Testing is done if you have certain medical conditions or if there are problems during the pregnancy.  FALSE LABOR  You may feel small, irregular contractions that eventually go away. These are called Braxton Hicks contractions, or   false labor. Contractions may last for hours, days, or even weeks before true labor sets in. If contractions come at regular intervals, intensify, or become painful, it is best to be seen by your caregiver.   SIGNS OF LABOR    Menstrual-like cramps.   Contractions that are 5 minutes apart or less.   Contractions that start on the top of the uterus and spread down to the lower abdomen and back.   A sense of increased pelvic pressure or back pain.   A watery or bloody mucus discharge that comes from the vagina.  If you have any of these signs before the 37th week of pregnancy, call your caregiver right away. You need to go to the hospital to get checked immediately.  HOME CARE INSTRUCTIONS    Avoid all  smoking, herbs, alcohol, and unprescribed drugs. These chemicals affect the formation and growth of the baby.   Follow your caregiver's instructions regarding medicine use. There are medicines that are either safe or unsafe to take during pregnancy.   Exercise only as directed by your caregiver. Experiencing uterine cramps is a good sign to stop exercising.   Continue to eat regular, healthy meals.   Wear a good support bra for breast tenderness.   Do not use hot tubs, steam rooms, or saunas.   Wear your seat belt at all times when driving.   Avoid raw meat, uncooked cheese, cat litter boxes, and soil used by cats. These carry germs that can cause birth defects in the baby.   Take your prenatal vitamins.   Try taking a stool softener (if your caregiver approves) if you develop constipation. Eat more high-fiber foods, such as fresh vegetables or fruit and whole grains. Drink plenty of fluids to keep your urine clear or pale yellow.   Take warm sitz baths to soothe any pain or discomfort caused by hemorrhoids. Use hemorrhoid cream if your caregiver approves.   If you develop varicose veins, wear support hose. Elevate your feet for 15 minutes, 3 4 times a day. Limit salt in your diet.   Avoid heavy lifting, wear low heal shoes, and practice good posture.   Rest a lot with your legs elevated if you have leg cramps or low back pain.   Visit your dentist if you have not gone during your pregnancy. Use a soft toothbrush to brush your teeth and be gentle when you floss.   A sexual relationship may be continued unless your caregiver directs you otherwise.   Do not travel far distances unless it is absolutely necessary and only with the approval of your caregiver.   Take prenatal classes to understand, practice, and ask questions about the labor and delivery.   Make a trial run to the hospital.   Pack your hospital bag.   Prepare the baby's nursery.   Continue to go to all your prenatal visits as directed  by your caregiver.  SEEK MEDICAL CARE IF:   You are unsure if you are in labor or if your water has broken.   You have dizziness.   You have mild pelvic cramps, pelvic pressure, or nagging pain in your abdominal area.   You have persistent nausea, vomiting, or diarrhea.   You have a bad smelling vaginal discharge.   You have pain with urination.  SEEK IMMEDIATE MEDICAL CARE IF:    You have a fever.   You are leaking fluid from your vagina.   You have spotting or bleeding from your vagina.     You have severe abdominal cramping or pain.   You have rapid weight loss or gain.   You have shortness of breath with chest pain.   You notice sudden or extreme swelling of your face, hands, ankles, feet, or legs.   You have not felt your baby move in over an hour.   You have severe headaches that do not go away with medicine.   You have vision changes.  Document Released: 06/25/2001 Document Revised: 03/03/2013 Document Reviewed: 09/01/2012  [employer] Patient Information 2014 [employer].

## 2013-12-16 LAB — DRUG SCREEN, URINE, NO CONFIRMATION
Amphetamine Screen, Ur: NEGATIVE
BARBITURATE QUANT UR: NEGATIVE
BENZODIAZEPINES.: NEGATIVE
Cocaine Metabolites: NEGATIVE
Creatinine,U: 138.7 mg/dL
METHADONE: NEGATIVE
Marijuana Metabolite: POSITIVE — AB
OPIATE SCREEN, URINE: NEGATIVE
PROPOXYPHENE: NEGATIVE
Phencyclidine (PCP): NEGATIVE

## 2013-12-16 LAB — GLUCOSE TOLERANCE, 2 HOURS W/ 1HR
GLUCOSE, FASTING: 63 mg/dL — AB (ref 70–99)
GLUCOSE: 78 mg/dL (ref 70–170)
Glucose, 2 hour: 75 mg/dL (ref 70–139)

## 2013-12-16 LAB — HSV 2 ANTIBODY, IGG: HSV 2 GLYCOPROTEIN G AB, IGG: 3.9 IV — AB

## 2013-12-16 LAB — ANTIBODY SCREEN: ANTIBODY SCREEN: NEGATIVE

## 2013-12-16 LAB — RPR

## 2013-12-16 LAB — HIV ANTIBODY (ROUTINE TESTING W REFLEX): HIV: NONREACTIVE

## 2013-12-20 ENCOUNTER — Encounter: Payer: Self-pay | Admitting: Women's Health

## 2013-12-20 DIAGNOSIS — R768 Other specified abnormal immunological findings in serum: Secondary | ICD-10-CM | POA: Insufficient documentation

## 2013-12-20 HISTORY — DX: Other specified abnormal immunological findings in serum: R76.8

## 2014-01-11 ENCOUNTER — Encounter: Payer: Self-pay | Admitting: Advanced Practice Midwife

## 2014-01-11 ENCOUNTER — Ambulatory Visit (INDEPENDENT_AMBULATORY_CARE_PROVIDER_SITE_OTHER): Payer: Self-pay | Admitting: Advanced Practice Midwife

## 2014-01-11 VITALS — BP 106/60 | Wt 256.5 lb

## 2014-01-11 DIAGNOSIS — Z331 Pregnant state, incidental: Secondary | ICD-10-CM

## 2014-01-11 DIAGNOSIS — Z3480 Encounter for supervision of other normal pregnancy, unspecified trimester: Secondary | ICD-10-CM

## 2014-01-11 DIAGNOSIS — Z1389 Encounter for screening for other disorder: Secondary | ICD-10-CM

## 2014-01-11 DIAGNOSIS — R768 Other specified abnormal immunological findings in serum: Secondary | ICD-10-CM

## 2014-01-11 DIAGNOSIS — Z3483 Encounter for supervision of other normal pregnancy, third trimester: Secondary | ICD-10-CM

## 2014-01-11 LAB — POCT URINALYSIS DIPSTICK
Blood, UA: NEGATIVE
Glucose, UA: NEGATIVE
KETONES UA: NEGATIVE
LEUKOCYTES UA: NEGATIVE
Nitrite, UA: NEGATIVE
PROTEIN UA: NEGATIVE

## 2014-01-11 NOTE — Progress Notes (Signed)
G2P1001 [redacted]w[redacted]d Estimated Date of Delivery: 03/04/14  Blood pressure 106/60, weight 256 lb 8 oz (116.348 kg), last menstrual period 05/28/2013.   BP weight and urine results all reviewed and noted.  Please refer to the obstetrical flow sheet for the fundal height and fetal heart rate documentation:   Patient reports good fetal movement, denies any bleeding and no rupture of membranes symptoms or regular contractions. Patient is without complaints. All questions were answered. Pt counseled about HSV2 +  Plan:  Continued routine obstetrical care,   Follow up in 2 weeks for OB appointment,

## 2014-01-11 NOTE — Progress Notes (Signed)
Pt denies any problems or concerns at this time.  

## 2014-01-12 ENCOUNTER — Encounter: Payer: Medicaid Other | Admitting: Women's Health

## 2014-01-25 ENCOUNTER — Ambulatory Visit (INDEPENDENT_AMBULATORY_CARE_PROVIDER_SITE_OTHER): Payer: Self-pay | Admitting: Advanced Practice Midwife

## 2014-01-25 ENCOUNTER — Encounter: Payer: Self-pay | Admitting: Advanced Practice Midwife

## 2014-01-25 VITALS — BP 104/66 | Wt 257.0 lb

## 2014-01-25 DIAGNOSIS — Z3480 Encounter for supervision of other normal pregnancy, unspecified trimester: Secondary | ICD-10-CM

## 2014-01-25 DIAGNOSIS — Z331 Pregnant state, incidental: Secondary | ICD-10-CM

## 2014-01-25 DIAGNOSIS — Z1389 Encounter for screening for other disorder: Secondary | ICD-10-CM

## 2014-01-25 DIAGNOSIS — Z3483 Encounter for supervision of other normal pregnancy, third trimester: Secondary | ICD-10-CM

## 2014-01-25 LAB — POCT URINALYSIS DIPSTICK
Glucose, UA: NEGATIVE
Ketones, UA: NEGATIVE
Leukocytes, UA: NEGATIVE
Nitrite, UA: NEGATIVE
Protein, UA: NEGATIVE
RBC UA: NEGATIVE

## 2014-01-25 MED ORDER — ACYCLOVIR 400 MG PO TABS: 400.0000 mg | ORAL_TABLET | Freq: Three times a day (TID) | ORAL | Status: DC

## 2014-01-25 NOTE — Progress Notes (Signed)
G2P1001 [redacted]w[redacted]d Estimated Date of Delivery: 03/04/14  Blood pressure 104/66, weight 257 lb (116.574 kg), last menstrual period 05/28/2013.   BP weight and urine results all reviewed and noted.  Please refer to the obstetrical flow sheet for the fundal height and fetal heart rate documentation:  Patient reports good fetal movement, denies any bleeding and no rupture of membranes symptoms or regular contractions. Patient is without complaints. All questions were answered.  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  Follow up in 2 weeks for OB appointment, schedule C/S; dont' need GBS culture

## 2014-01-25 NOTE — Progress Notes (Signed)
Pt states that she has some questions for the provider.

## 2014-01-26 ENCOUNTER — Telehealth: Payer: Self-pay | Admitting: *Deleted

## 2014-01-26 ENCOUNTER — Telehealth: Payer: Self-pay | Admitting: Advanced Practice Midwife

## 2014-01-26 ENCOUNTER — Telehealth: Payer: Self-pay | Admitting: Obstetrics and Gynecology

## 2014-01-26 MED ORDER — ZOLPIDEM TARTRATE 5 MG PO TABS: 5.0000 mg | ORAL_TABLET | Freq: Every evening | ORAL | Status: DC | PRN

## 2014-01-26 NOTE — Telephone Encounter (Signed)
ambien 5 mg # 30 1 rf

## 2014-01-26 NOTE — Telephone Encounter (Signed)
LM on pts VM that RX was written but not signed before Drenda FreezeFran left our office, it will be signed and faxed to pharmacy tomorrow.

## 2014-01-26 NOTE — Telephone Encounter (Signed)
Pt informed that Drenda FreezeFran is sending a medication to her pharmacy.

## 2014-01-27 ENCOUNTER — Telehealth: Payer: Self-pay | Admitting: *Deleted

## 2014-01-27 NOTE — Telephone Encounter (Signed)
Pt informed Rx for Ambien at front desk for pick up.

## 2014-02-07 ENCOUNTER — Encounter: Payer: Self-pay | Admitting: Obstetrics and Gynecology

## 2014-02-07 ENCOUNTER — Ambulatory Visit (INDEPENDENT_AMBULATORY_CARE_PROVIDER_SITE_OTHER): Payer: Self-pay | Admitting: Obstetrics and Gynecology

## 2014-02-07 VITALS — BP 110/60 | Wt 256.0 lb

## 2014-02-07 DIAGNOSIS — Z331 Pregnant state, incidental: Secondary | ICD-10-CM

## 2014-02-07 DIAGNOSIS — Z3483 Encounter for supervision of other normal pregnancy, third trimester: Secondary | ICD-10-CM

## 2014-02-07 DIAGNOSIS — Z1389 Encounter for screening for other disorder: Secondary | ICD-10-CM

## 2014-02-07 DIAGNOSIS — Z3480 Encounter for supervision of other normal pregnancy, unspecified trimester: Secondary | ICD-10-CM

## 2014-02-07 LAB — POCT URINALYSIS DIPSTICK
Blood, UA: NEGATIVE
GLUCOSE UA: NEGATIVE
Ketones, UA: NEGATIVE
Leukocytes, UA: NEGATIVE
Nitrite, UA: NEGATIVE
Protein, UA: NEGATIVE

## 2014-02-07 NOTE — Progress Notes (Signed)
Pt states that she has a rash on her right hand that is very irritating. Pt states that she has had the rash for a couple days now. Pt denies any other problems or concerns at this time.

## 2014-02-07 NOTE — Progress Notes (Signed)
G2P1001 945w3d Estimated Date of Delivery: 03/04/14  Blood pressure 110/60, weight 256 lb (116.121 kg), last menstrual period 05/28/2013.   BP weight and urine results all reviewed and noted.  Please refer to the obstetrical flow sheet for the fundal height and fetal heart rate documentation:  Patient reports good fetal movement, denies any bleeding and no rupture of membranes symptoms or regular contractions. Patient is without complaints. All questions were answered.  Plan:  Continued routine obstetrical care, cesarean section on 8/14 at 3:30  Follow up in 1 week for OB appointment, GBS/GC/CHL

## 2014-02-11 ENCOUNTER — Encounter (HOSPITAL_COMMUNITY): Payer: Self-pay

## 2014-02-14 ENCOUNTER — Ambulatory Visit (INDEPENDENT_AMBULATORY_CARE_PROVIDER_SITE_OTHER): Payer: Self-pay | Admitting: Women's Health

## 2014-02-14 ENCOUNTER — Encounter: Payer: Self-pay | Admitting: Women's Health

## 2014-02-14 VITALS — BP 118/68 | Wt 262.0 lb

## 2014-02-14 DIAGNOSIS — Z3483 Encounter for supervision of other normal pregnancy, third trimester: Secondary | ICD-10-CM

## 2014-02-14 DIAGNOSIS — Z331 Pregnant state, incidental: Secondary | ICD-10-CM

## 2014-02-14 DIAGNOSIS — Z1389 Encounter for screening for other disorder: Secondary | ICD-10-CM

## 2014-02-14 DIAGNOSIS — Z3480 Encounter for supervision of other normal pregnancy, unspecified trimester: Secondary | ICD-10-CM

## 2014-02-14 DIAGNOSIS — R768 Other specified abnormal immunological findings in serum: Secondary | ICD-10-CM

## 2014-02-14 LAB — POCT URINALYSIS DIPSTICK
Blood, UA: NEGATIVE
GLUCOSE UA: NEGATIVE
Ketones, UA: NEGATIVE
Nitrite, UA: NEGATIVE
Protein, UA: NEGATIVE

## 2014-02-14 NOTE — Patient Instructions (Signed)
Circumcision: $507 at hospital, $244 at [facility], has to be paid up front before it is done. If you want the circumcision done at [facility] you can make payments during pregnancy. If you are interested in this, see receptionist at check-out.  If your baby is older than 28 days when you have the circumcision done at [facility], the fee will go up to $325.50.    Call the office ([phone]) or go to [facility] if:  You begin to have strong, frequent contractions  Your water breaks.  Sometimes it is a big gush of fluid, sometimes it is just a trickle that keeps getting your panties wet or running down your legs  You have vaginal bleeding.  It is normal to have a small amount of spotting if your cervix was checked.   You don't feel your baby moving like normal.  If you don't, get you something to eat and drink and lay down and focus on feeling your baby move.  You should feel at least 10 movements in 2 hours.  If you don't, you should call the office or go to [facility].    Braxton Hicks Contractions Contractions of the uterus can occur throughout pregnancy. Contractions are not always a sign that you are in labor.  WHAT ARE BRAXTON HICKS CONTRACTIONS?  Contractions that occur before labor are called Braxton Hicks contractions, or false labor. Toward the end of pregnancy (32-34 weeks), these contractions can develop more often and may become more forceful. This is not true labor because these contractions do not result in opening (dilatation) and thinning of the cervix. They are sometimes difficult to tell apart from true labor because these contractions can be forceful and people have different pain tolerances. You should not feel embarrassed if you go to the hospital with false labor. Sometimes, the only way to tell if you are in true labor is for your health care provider to look for changes in the cervix. If there are no prenatal problems or other health problems associated with the  pregnancy, it is completely safe to be sent home with false labor and await the onset of true labor. HOW CAN YOU TELL THE DIFFERENCE BETWEEN TRUE AND FALSE LABOR? False Labor  The contractions of false labor are usually shorter and not as hard as those of true labor.   The contractions are usually irregular.   The contractions are often felt in the front of the lower abdomen and in the groin.   The contractions may go away when you walk around or change positions while lying down.   The contractions get weaker and are shorter lasting as time goes on.   The contractions do not usually become progressively stronger, regular, and closer together as with true labor.  True Labor  Contractions in true labor last 30-70 seconds, become very regular, usually become more intense, and increase in frequency.   The contractions do not go away with walking.   The discomfort is usually felt in the top of the uterus and spreads to the lower abdomen and low back.   True labor can be determined by your health care provider with an exam. This will show that the cervix is dilating and getting thinner.  WHAT TO REMEMBER  Keep up with your usual exercises and follow other instructions given by your health care provider.   Take medicines as directed by your health care provider.   Keep your regular prenatal appointments.   Eat and   drink lightly if you think you are going into labor.   If Braxton Hicks contractions are making you uncomfortable:   Change your position from lying down or resting to walking, or from walking to resting.   Sit and rest in a tub of warm water.   Drink 2-3 glasses of water. Dehydration may cause these contractions.   Do slow and deep breathing several times an hour.  WHEN SHOULD I SEEK IMMEDIATE MEDICAL CARE? Seek immediate medical care if:  Your contractions become stronger, more regular, and closer together.   You have fluid leaking or gushing  from your vagina.   You have a fever.   You pass blood-tinged mucus.   You have vaginal bleeding.   You have continuous abdominal pain.   You have low back pain that you never had before.   You feel your baby's head pushing down and causing pelvic pressure.   Your baby is not moving as much as it used to.  Document Released: 07/01/2005 Document Revised: 07/06/2013 Document Reviewed: 04/12/2013 [employer] Patient Information 2015 [employer]. This information is not intended to replace advice given to you by your health care provider. Make sure you discuss any questions you have with your health care provider.  

## 2014-02-14 NOTE — Progress Notes (Signed)
Low-risk OB appointment G2P1001 [redacted]w[redacted]d Estimated Date of Delivery: 03/04/14 BP 118/68  Wt 262 lb (118.842 kg)  LMP 05/28/2013  BP, weight, and urine reviewed.  Refer to obstetrical flow sheet for FH & FHR.  Reports good fm.  Denies regular uc's, lof, vb, or uti s/s. Not sleeping well, ambien not helping. Discussed things to help, can try unisom, benadryl, or tylenol pm instead of ambien.  Still undecided about circ-doesn't have the $, discussed best to do soon after birth if she is going to want it done Reviewed labor s/s, fkc. Has RLTCS scheduled for 8/14.  GBS+urine earlier in pregnancy, no need for swab today.  Plan:  Continue routine obstetrical care  F/U in 1wk for OB appointment

## 2014-02-14 NOTE — Addendum Note (Signed)
Addended by: Cheral Marker on: 02/14/2014 02:34 PM   Modules accepted: Orders

## 2014-02-15 ENCOUNTER — Encounter: Payer: Self-pay | Admitting: Women's Health

## 2014-02-15 ENCOUNTER — Telehealth: Payer: Self-pay | Admitting: Advanced Practice Midwife

## 2014-02-15 LAB — GC/CHLAMYDIA PROBE AMP
CT Probe RNA: NEGATIVE
GC Probe RNA: NEGATIVE

## 2014-02-15 NOTE — Telephone Encounter (Signed)
Pt states was having some spotting last night but has not seen any this morning, she is not having any cramping, no loss of fluids and reports good FM.  Advised pt to push fluids, rest and call us if has any further concerns.  Advised to go to [facility] if has sudden gush of fluids, contractions 5 - 10 minutes apart or heavy bleeding.  Pt verbalized understanding.

## 2014-02-20 ENCOUNTER — Other Ambulatory Visit: Payer: Self-pay | Admitting: Obstetrics and Gynecology

## 2014-02-20 DIAGNOSIS — Z01818 Encounter for other preprocedural examination: Secondary | ICD-10-CM

## 2014-02-20 NOTE — H&P (Signed)
Sherri Woodward is a 28 y.o. female presenting for repeat cesarean section. She is [redacted] weeks gestation.. she has declined trial of labor and requests repeat cesarean section and has been counseled through our office. Prenatal course has been straightforward and, with patient on acyclovir suppression for HSV-2, and had treatment early in the pregnancy for group B strep urinary tract infection. She has had appropriate weight gain and fundal height growth. History OB History   Grav Para Term Preterm Abortions TAB SAB Ect Mult Living   2 1 1       1      Past Medical History  Diagnosis Date  . Pregnancy   . Smoker 08/10/2013  . 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 Cesarean section     Family History: family history includes Heart Problems in her father; Hypertension in her maternal grandfather and mother; Kidney disease in her maternal grandfather; Stroke in her father. Social History:  reports that she has been smoking Cigarettes.  She has been smoking about 0.25 packs per day. She has never used smokeless tobacco. She reports that she does not drink alcohol or use illicit drugs.   Prenatal Transfer Tool  Maternal Diabetes: No Genetic Screening: Normal Maternal Ultrasounds/Referrals: Normal Fetal Ultrasounds or other Referrals:  None Maternal Substance Abuse:  Yes:  Type: Marijuana, Other:  positive test earlier in pregnancy patient counseled Significant Maternal Medications:  None Significant Maternal Lab Results:  Lab values include: Group B Strep positive Other Comments:  The patient desires circumcision, is uncertain as to when she will have it done likely at our office  ROS    Last menstrual period 05/28/2013. Exam Physical Exam  Constitutional: She appears well-developed and well-nourished.  HENT:  Head: Normocephalic.  Eyes: Pupils are equal, round, and reactive to light.  Neck:  Neck supple.  Cardiovascular: Normal rate.   Respiratory: Effort normal and breath sounds normal.  GI: Soft.  Gravid uterus consistent with term gestation   well-healed lower abdominal scar without hernias or masses  Prenatal labs: ABO, Rh: O/POS/-- (01/27 1636) Antibody: NEG (06/03 0000) Rubella: 2.84 (01/27 1636) RPR: NON REAC (06/03 0000)  HBsAg: NEGATIVE (01/27 1636)  HIV: NONREACTIVE (06/03 0000)  GBS:   positive urine culture in first trimester  Assessment/Plan: Pregnancy 39 weeks, prior cesarean section declining trial of labor GBS positive next History of HSV 2, on suppression without evidence of lesions Plan: Repeat cesarean section 3:30 PM on 02/25/2014  Sherri Woodward 02/20/2014, 5:03 PM

## 2014-02-21 ENCOUNTER — Ambulatory Visit (INDEPENDENT_AMBULATORY_CARE_PROVIDER_SITE_OTHER): Payer: Self-pay | Admitting: Women's Health

## 2014-02-21 ENCOUNTER — Encounter: Payer: Self-pay | Admitting: Women's Health

## 2014-02-21 VITALS — BP 112/60 | Wt 260.0 lb

## 2014-02-21 DIAGNOSIS — Z1389 Encounter for screening for other disorder: Secondary | ICD-10-CM

## 2014-02-21 DIAGNOSIS — Z331 Pregnant state, incidental: Secondary | ICD-10-CM

## 2014-02-21 DIAGNOSIS — Z3480 Encounter for supervision of other normal pregnancy, unspecified trimester: Secondary | ICD-10-CM

## 2014-02-21 DIAGNOSIS — Z3483 Encounter for supervision of other normal pregnancy, third trimester: Secondary | ICD-10-CM

## 2014-02-21 DIAGNOSIS — Z98891 History of uterine scar from previous surgery: Secondary | ICD-10-CM

## 2014-02-21 LAB — POCT URINALYSIS DIPSTICK
Blood, UA: NEGATIVE
GLUCOSE UA: NEGATIVE
Ketones, UA: NEGATIVE
Leukocytes, UA: NEGATIVE
Nitrite, UA: NEGATIVE

## 2014-02-21 NOTE — Patient Instructions (Addendum)
[facility]: [address] [place], [place]   Do not eat or drink anything after 6am the morning of your surgery, and only eat something light (cereal, toast, etc.)  Call the office [phone]) or go to [facility] if:  You begin to have strong, frequent contractions  Your water breaks.  Sometimes it is a big gush of fluid, sometimes it is just a trickle that keeps getting your panties wet or running down your legs  You have vaginal bleeding.  It is normal to have a small amount of spotting if your cervix was checked.   You don't feel your baby moving like normal.  If you don't, get you something to eat and drink and lay down and focus on feeling your baby move.  You should feel at least 10 movements in 2 hours.  If you don't, you should call the office or go to [facility].    [facility] Contractions Contractions of the uterus can occur throughout pregnancy. Contractions are not always a sign that you are in labor.  WHAT ARE BRAXTON HICKS CONTRACTIONS?  Contractions that occur before labor are called Braxton Hicks contractions, or false labor. Toward the end of pregnancy (32-34 weeks), these contractions can develop more often and may become more forceful. This is not true labor because these contractions do not result in opening (dilatation) and thinning of the cervix. They are sometimes difficult to tell apart from true labor because these contractions can be forceful and people have different pain tolerances. You should not feel embarrassed if you go to the hospital with false labor. Sometimes, the only way to tell if you are in true labor is for your health care provider to look for changes in the cervix. If there are no prenatal problems or other health problems associated with the pregnancy, it is completely safe to be sent home with false labor and await the onset of true labor. HOW CAN YOU TELL THE DIFFERENCE BETWEEN TRUE AND FALSE LABOR? False Labor  The  contractions of false labor are usually shorter and not as hard as those of true labor.   The contractions are usually irregular.   The contractions are often felt in the front of the lower abdomen and in the groin.   The contractions may go away when you walk around or change positions while lying down.   The contractions get weaker and are shorter lasting as time goes on.   The contractions do not usually become progressively stronger, regular, and closer together as with true labor.  True Labor  Contractions in true labor last 30-70 seconds, become very regular, usually become more intense, and increase in frequency.   The contractions do not go away with walking.   The discomfort is usually felt in the top of the uterus and spreads to the lower abdomen and low back.   True labor can be determined by your health care provider with an exam. This will show that the cervix is dilating and getting thinner.  WHAT TO REMEMBER  Keep up with your usual exercises and follow other instructions given by your health care provider.   Take medicines as directed by your health care provider.   Keep your regular prenatal appointments.   Eat and drink lightly if you think you are going into labor.   If Braxton Hicks contractions are making you uncomfortable:   Change your position from lying down or resting to walking, or from walking to resting.  Sit and rest in a tub of warm water.   Drink 2-3 glasses of water. Dehydration may cause these contractions.   Do slow and deep breathing several times an hour.  WHEN SHOULD I SEEK IMMEDIATE MEDICAL CARE? Seek immediate medical care if:  Your contractions become stronger, more regular, and closer together.   You have fluid leaking or gushing from your vagina.   You have a fever.   You pass blood-tinged mucus.   You have vaginal bleeding.   You have continuous abdominal pain.   You have low back pain that you  never had before.   You feel your baby's head pushing down and causing pelvic pressure.   Your baby is not moving as much as it used to.  Document Released: 07/01/2005 Document Revised: 07/06/2013 Document Reviewed: 04/12/2013 [facility] Patient Information 2015 [place] [place]. This information is not intended to replace advice given to you by your health care provider. Make sure you discuss any questions you have with your health care provider.

## 2014-02-21 NOTE — Progress Notes (Signed)
Low-risk OB appointment G2P1001 [redacted]w[redacted]d Estimated Date of Delivery: 03/04/14 BP 112/60  Wt 260 lb (117.935 kg)  LMP 05/28/2013  BP, weight, and urine reviewed.  Refer to obstetrical flow sheet for FH & FHR.  Reports good fm.  Denies regular uc's, lof, vb, or uti s/s. Lots of pressure.  SVE per request: cl/th/-3 Reviewed labor s/s, fkc. Plan:  RLTCS 8/14 @ 1530 as scheduled, npo after 0600, may have light breakfast (cereal, toast) prior to 0600 F/U on 8/21 for c/s incision check

## 2014-02-23 ENCOUNTER — Encounter (HOSPITAL_COMMUNITY): Payer: Self-pay

## 2014-02-23 ENCOUNTER — Encounter (HOSPITAL_COMMUNITY)
Admission: RE | Admit: 2014-02-23 | Discharge: 2014-02-23 | Disposition: A | Discharge status: Home / Self Care | Payer: Medicaid Other | Source: Ambulatory Visit | Attending: Obstetrics and Gynecology | Admitting: Obstetrics and Gynecology

## 2014-02-23 HISTORY — DX: Other specified health status: Z78.9

## 2014-02-23 LAB — CBC
HCT: 32.8 % — ABNORMAL LOW (ref 36.0–46.0)
HEMOGLOBIN: 11.2 g/dL — AB (ref 12.0–15.0)
MCH: 31.9 pg (ref 26.0–34.0)
MCHC: 34.1 g/dL (ref 30.0–36.0)
MCV: 93.4 fL (ref 78.0–100.0)
Platelets: 245 10*3/uL (ref 150–400)
RBC: 3.51 MIL/uL — ABNORMAL LOW (ref 3.87–5.11)
RDW: 13.4 % (ref 11.5–15.5)
WBC: 7.9 10*3/uL (ref 4.0–10.5)

## 2014-02-23 LAB — TYPE AND SCREEN
ABO/RH(D): O POS
Antibody Screen: NEGATIVE

## 2014-02-23 LAB — RPR

## 2014-02-23 LAB — ABO/RH: ABO/RH(D): O POS

## 2014-02-23 NOTE — Patient Instructions (Signed)
20 Sherri Woodward  02/23/2014   Your procedure is scheduled on:  02/25/14  Enter through the Main Entrance of [facility] at [facility]   Pick up the phone at the desk and dial [phone].   Call this number if you have problems the morning of surgery: [phone]   Remember:   Do not eat food:After Midnight.  Do not drink clear liquids: 4 Hours before arrival.  Take these medicines the morning of surgery with A SIP OF WATER: NA   Do not wear jewelry, make-up or nail polish.  Do not wear lotions, powders, or perfumes. You may wear deodorant.  Do not shave 48 hours prior to surgery.  Do not bring valuables to the hospital.  [facility] is not   responsible for any belongings or valuables brought to the hospital.  Contacts, dentures or bridgework may not be worn into surgery.  Leave suitcase in the car. After surgery it may be brought to your room.  For patients admitted to the hospital, checkout time is 11:00 AM the day of              discharge.   Patients discharged the day of surgery will not be allowed to drive             home.  Name and phone number of your driver: NA  Special Instructions:      Please read over the following fact sheets that you were given:   Surgical Site Infection Prevention

## 2014-02-25 ENCOUNTER — Inpatient Hospital Stay (HOSPITAL_COMMUNITY)
Admission: RE | Admit: 2014-02-25 | Discharge: 2014-02-27 | DRG: 765 | Disposition: A | Discharge status: Home / Self Care | Payer: Medicaid Other | Source: Ambulatory Visit | Attending: Obstetrics and Gynecology | Admitting: Obstetrics and Gynecology

## 2014-02-25 ENCOUNTER — Encounter (HOSPITAL_COMMUNITY): Payer: Self-pay | Admitting: Anesthesiology

## 2014-02-25 ENCOUNTER — Encounter (HOSPITAL_COMMUNITY): Payer: Medicaid Other | Admitting: Anesthesiology

## 2014-02-25 ENCOUNTER — Encounter (HOSPITAL_COMMUNITY)
Admission: RE | Disposition: A | Discharge status: Home / Self Care | Payer: Self-pay | Source: Ambulatory Visit | Attending: Obstetrics and Gynecology

## 2014-02-25 ENCOUNTER — Inpatient Hospital Stay (HOSPITAL_COMMUNITY): Payer: Medicaid Other | Admitting: Anesthesiology

## 2014-02-25 DIAGNOSIS — Z841 Family history of disorders of kidney and ureter: Secondary | ICD-10-CM | POA: Diagnosis not present

## 2014-02-25 DIAGNOSIS — O98519 Other viral diseases complicating pregnancy, unspecified trimester: Secondary | ICD-10-CM | POA: Diagnosis present

## 2014-02-25 DIAGNOSIS — Z8249 Family history of ischemic heart disease and other diseases of the circulatory system: Secondary | ICD-10-CM | POA: Diagnosis not present

## 2014-02-25 DIAGNOSIS — Z22330 Carrier of Group B streptococcus: Secondary | ICD-10-CM

## 2014-02-25 DIAGNOSIS — Z6841 Body Mass Index (BMI) 40.0 and over, adult: Secondary | ICD-10-CM | POA: Diagnosis not present

## 2014-02-25 DIAGNOSIS — Z823 Family history of stroke: Secondary | ICD-10-CM

## 2014-02-25 DIAGNOSIS — A6000 Herpesviral infection of urogenital system, unspecified: Secondary | ICD-10-CM | POA: Diagnosis present

## 2014-02-25 DIAGNOSIS — O99214 Obesity complicating childbirth: Secondary | ICD-10-CM

## 2014-02-25 DIAGNOSIS — O99892 Other specified diseases and conditions complicating childbirth: Secondary | ICD-10-CM | POA: Diagnosis present

## 2014-02-25 DIAGNOSIS — O34219 Maternal care for unspecified type scar from previous cesarean delivery: Principal | ICD-10-CM | POA: Diagnosis present

## 2014-02-25 DIAGNOSIS — O99334 Smoking (tobacco) complicating childbirth: Secondary | ICD-10-CM

## 2014-02-25 DIAGNOSIS — O99324 Drug use complicating childbirth: Secondary | ICD-10-CM

## 2014-02-25 DIAGNOSIS — Z98891 History of uterine scar from previous surgery: Secondary | ICD-10-CM

## 2014-02-25 DIAGNOSIS — E669 Obesity, unspecified: Secondary | ICD-10-CM | POA: Diagnosis present

## 2014-02-25 DIAGNOSIS — O9989 Other specified diseases and conditions complicating pregnancy, childbirth and the puerperium: Secondary | ICD-10-CM

## 2014-02-25 DIAGNOSIS — F1920 Other psychoactive substance dependence, uncomplicated: Secondary | ICD-10-CM

## 2014-02-25 DIAGNOSIS — Z3483 Encounter for supervision of other normal pregnancy, third trimester: Secondary | ICD-10-CM

## 2014-02-25 SURGERY — Surgical Case
Anesthesia: Spinal | Site: Abdomen

## 2014-02-25 MED ORDER — ONDANSETRON HCL 4 MG/2ML IJ SOLN: 4.0000 mg | INTRAMUSCULAR | Status: DC | PRN

## 2014-02-25 MED ORDER — PHENYLEPHRINE 8 MG IN D5W 100 ML (0.08MG/ML) PREMIX OPTIME
INJECTION | INTRAVENOUS | Status: AC
  Filled 2014-02-25: qty 100

## 2014-02-25 MED ORDER — 0.9 % SODIUM CHLORIDE (POUR BTL) OPTIME
TOPICAL | Status: DC | PRN
  Administered 2014-02-25: 1000 mL

## 2014-02-25 MED ORDER — WITCH HAZEL-GLYCERIN EX PADS: 1.0000 "application " | MEDICATED_PAD | CUTANEOUS | Status: DC | PRN

## 2014-02-25 MED ORDER — SCOPOLAMINE 1 MG/3DAYS TD PT72: 1.0000 | MEDICATED_PATCH | Freq: Once | TRANSDERMAL | Status: DC

## 2014-02-25 MED ORDER — METOCLOPRAMIDE HCL 5 MG/ML IJ SOLN: 10.0000 mg | Freq: Three times a day (TID) | INTRAMUSCULAR | Status: DC | PRN

## 2014-02-25 MED ORDER — BUPIVACAINE IN DEXTROSE 0.75-8.25 % IT SOLN
INTRATHECAL | Status: DC | PRN
  Administered 2014-02-25: 1.4 mL via INTRATHECAL

## 2014-02-25 MED ORDER — ZOLPIDEM TARTRATE 5 MG PO TABS
5.0000 mg | ORAL_TABLET | Freq: Every evening | ORAL | Status: DC | PRN
Start: 2014-02-25 — End: 2014-02-27

## 2014-02-25 MED ORDER — OXYTOCIN 40 UNITS IN LACTATED RINGERS INFUSION - SIMPLE MED: 62.5000 mL/h | INTRAVENOUS | Status: AC

## 2014-02-25 MED ORDER — IBUPROFEN 600 MG PO TABS
600.0000 mg | ORAL_TABLET | Freq: Four times a day (QID) | ORAL | Status: DC
  Administered 2014-02-26 – 2014-02-27 (×6): 600 mg via ORAL
  Filled 2014-02-25 (×6): qty 1

## 2014-02-25 MED ORDER — CEFAZOLIN SODIUM-DEXTROSE 2-3 GM-% IV SOLR: 2.0000 g | INTRAVENOUS | Status: DC

## 2014-02-25 MED ORDER — OXYTOCIN 10 UNIT/ML IJ SOLN
INTRAMUSCULAR | Status: AC
  Filled 2014-02-25: qty 4

## 2014-02-25 MED ORDER — SCOPOLAMINE 1 MG/3DAYS TD PT72
MEDICATED_PATCH | TRANSDERMAL | Status: AC
  Administered 2014-02-25: 1.5 mg via TRANSDERMAL
  Filled 2014-02-25: qty 1

## 2014-02-25 MED ORDER — DIPHENHYDRAMINE HCL 50 MG/ML IJ SOLN: 12.5000 mg | INTRAMUSCULAR | Status: DC | PRN

## 2014-02-25 MED ORDER — SCOPOLAMINE 1 MG/3DAYS TD PT72
1.0000 | MEDICATED_PATCH | Freq: Once | TRANSDERMAL | Status: DC
  Administered 2014-02-25: 1.5 mg via TRANSDERMAL

## 2014-02-25 MED ORDER — DIPHENHYDRAMINE HCL 25 MG PO CAPS: 25.0000 mg | ORAL_CAPSULE | Freq: Four times a day (QID) | ORAL | Status: DC | PRN

## 2014-02-25 MED ORDER — LACTATED RINGERS IV SOLN
INTRAVENOUS | Status: DC
  Administered 2014-02-25 (×3): via INTRAVENOUS

## 2014-02-25 MED ORDER — MENTHOL 3 MG MT LOZG: 1.0000 | LOZENGE | OROMUCOSAL | Status: DC | PRN

## 2014-02-25 MED ORDER — MEPERIDINE HCL 25 MG/ML IJ SOLN: 6.2500 mg | INTRAMUSCULAR | Status: DC | PRN

## 2014-02-25 MED ORDER — KETOROLAC TROMETHAMINE 30 MG/ML IJ SOLN
30.0000 mg | Freq: Four times a day (QID) | INTRAMUSCULAR | Status: AC | PRN
  Administered 2014-02-25: 30 mg via INTRAMUSCULAR

## 2014-02-25 MED ORDER — PRENATAL MULTIVITAMIN CH
1.0000 | ORAL_TABLET | Freq: Every day | ORAL | Status: DC
  Administered 2014-02-26: 1 via ORAL
  Filled 2014-02-25: qty 1

## 2014-02-25 MED ORDER — MORPHINE SULFATE (PF) 0.5 MG/ML IJ SOLN
INTRAMUSCULAR | Status: DC | PRN
  Administered 2014-02-25: .2 mg via INTRATHECAL

## 2014-02-25 MED ORDER — LANOLIN HYDROUS EX OINT: 1.0000 "application " | TOPICAL_OINTMENT | CUTANEOUS | Status: DC | PRN

## 2014-02-25 MED ORDER — NALBUPHINE HCL 10 MG/ML IJ SOLN: 5.0000 mg | INTRAMUSCULAR | Status: DC | PRN

## 2014-02-25 MED ORDER — SIMETHICONE 80 MG PO CHEW
80.0000 mg | CHEWABLE_TABLET | ORAL | Status: DC
  Administered 2014-02-26 (×2): 80 mg via ORAL
  Filled 2014-02-25 (×2): qty 1

## 2014-02-25 MED ORDER — LACTATED RINGERS IV SOLN
40.0000 [IU] | INTRAVENOUS | Status: DC | PRN
  Administered 2014-02-25: 40 [IU] via INTRAVENOUS

## 2014-02-25 MED ORDER — FENTANYL CITRATE 0.05 MG/ML IJ SOLN
INTRAMUSCULAR | Status: DC | PRN
  Administered 2014-02-25: 10 ug via INTRATHECAL

## 2014-02-25 MED ORDER — PHENYLEPHRINE 8 MG IN D5W 100 ML (0.08MG/ML) PREMIX OPTIME
INJECTION | INTRAVENOUS | Status: DC | PRN
  Administered 2014-02-25: 60 ug/min via INTRAVENOUS

## 2014-02-25 MED ORDER — PROMETHAZINE HCL 25 MG/ML IJ SOLN
6.2500 mg | INTRAMUSCULAR | Status: DC | PRN
Start: 2014-02-25 — End: 2014-02-25

## 2014-02-25 MED ORDER — CEFAZOLIN SODIUM-DEXTROSE 2-3 GM-% IV SOLR
INTRAVENOUS | Status: AC
  Administered 2014-02-25: 2 g via INTRAVENOUS
  Filled 2014-02-25: qty 50

## 2014-02-25 MED ORDER — ONDANSETRON HCL 4 MG/2ML IJ SOLN
INTRAMUSCULAR | Status: DC | PRN
  Administered 2014-02-25: 4 mg via INTRAVENOUS

## 2014-02-25 MED ORDER — IBUPROFEN 600 MG PO TABS: 600.0000 mg | ORAL_TABLET | Freq: Four times a day (QID) | ORAL | Status: DC | PRN

## 2014-02-25 MED ORDER — ONDANSETRON HCL 4 MG/2ML IJ SOLN: 4.0000 mg | Freq: Three times a day (TID) | INTRAMUSCULAR | Status: DC | PRN

## 2014-02-25 MED ORDER — KETOROLAC TROMETHAMINE 30 MG/ML IJ SOLN: 30.0000 mg | Freq: Four times a day (QID) | INTRAMUSCULAR | Status: AC | PRN

## 2014-02-25 MED ORDER — KETOROLAC TROMETHAMINE 30 MG/ML IJ SOLN: 15.0000 mg | Freq: Once | INTRAMUSCULAR | Status: DC | PRN

## 2014-02-25 MED ORDER — SODIUM CHLORIDE 0.9 % IJ SOLN: 3.0000 mL | INTRAMUSCULAR | Status: DC | PRN

## 2014-02-25 MED ORDER — LACTATED RINGERS IV SOLN
INTRAVENOUS | Status: DC
Start: 2014-02-25 — End: 2014-02-27
  Administered 2014-02-25: 23:00:00 via INTRAVENOUS

## 2014-02-25 MED ORDER — MORPHINE SULFATE 0.5 MG/ML IJ SOLN
INTRAMUSCULAR | Status: AC
  Filled 2014-02-25: qty 10

## 2014-02-25 MED ORDER — LACTATED RINGERS IV SOLN
Freq: Once | INTRAVENOUS | Status: AC
  Administered 2014-02-25: 16:00:00 via INTRAVENOUS

## 2014-02-25 MED ORDER — TETANUS-DIPHTH-ACELL PERTUSSIS 5-2.5-18.5 LF-MCG/0.5 IM SUSP: 0.5000 mL | Freq: Once | INTRAMUSCULAR | Status: DC

## 2014-02-25 MED ORDER — ONDANSETRON HCL 4 MG PO TABS: 4.0000 mg | ORAL_TABLET | ORAL | Status: DC | PRN

## 2014-02-25 MED ORDER — SIMETHICONE 80 MG PO CHEW
80.0000 mg | CHEWABLE_TABLET | Freq: Three times a day (TID) | ORAL | Status: DC
  Administered 2014-02-26 – 2014-02-27 (×3): 80 mg via ORAL
  Filled 2014-02-25 (×3): qty 1

## 2014-02-25 MED ORDER — ONDANSETRON HCL 4 MG/2ML IJ SOLN
INTRAMUSCULAR | Status: AC
  Filled 2014-02-25: qty 2

## 2014-02-25 MED ORDER — KETOROLAC TROMETHAMINE 30 MG/ML IJ SOLN
INTRAMUSCULAR | Status: AC
  Administered 2014-02-25: 30 mg via INTRAMUSCULAR
  Filled 2014-02-25: qty 1

## 2014-02-25 MED ORDER — DIPHENHYDRAMINE HCL 50 MG/ML IJ SOLN: 25.0000 mg | INTRAMUSCULAR | Status: DC | PRN

## 2014-02-25 MED ORDER — NALOXONE HCL 1 MG/ML IJ SOLN: 1.0000 ug/kg/h | INTRAVENOUS | Status: DC | PRN

## 2014-02-25 MED ORDER — SENNOSIDES-DOCUSATE SODIUM 8.6-50 MG PO TABS
2.0000 | ORAL_TABLET | ORAL | Status: DC
  Administered 2014-02-26 (×2): 2 via ORAL
  Filled 2014-02-25 (×2): qty 2

## 2014-02-25 MED ORDER — HYDROMORPHONE HCL PF 1 MG/ML IJ SOLN: 0.2500 mg | INTRAMUSCULAR | Status: DC | PRN

## 2014-02-25 MED ORDER — NALOXONE HCL 0.4 MG/ML IJ SOLN: 0.4000 mg | INTRAMUSCULAR | Status: DC | PRN

## 2014-02-25 MED ORDER — DIBUCAINE 1 % RE OINT: 1.0000 "application " | TOPICAL_OINTMENT | RECTAL | Status: DC | PRN

## 2014-02-25 MED ORDER — DIPHENHYDRAMINE HCL 25 MG PO CAPS
25.0000 mg | ORAL_CAPSULE | ORAL | Status: DC | PRN
  Administered 2014-02-26: 25 mg via ORAL
  Filled 2014-02-25: qty 1

## 2014-02-25 MED ORDER — SIMETHICONE 80 MG PO CHEW: 80.0000 mg | CHEWABLE_TABLET | ORAL | Status: DC | PRN

## 2014-02-25 MED ORDER — MEPERIDINE HCL 25 MG/ML IJ SOLN
6.2500 mg | INTRAMUSCULAR | Status: DC | PRN
Start: 2014-02-25 — End: 2014-02-25

## 2014-02-25 MED ORDER — FENTANYL CITRATE 0.05 MG/ML IJ SOLN
INTRAMUSCULAR | Status: AC
  Filled 2014-02-25: qty 2

## 2014-02-25 MED ORDER — OXYCODONE-ACETAMINOPHEN 5-325 MG PO TABS
1.0000 | ORAL_TABLET | ORAL | Status: DC | PRN
  Administered 2014-02-26 – 2014-02-27 (×5): 1 via ORAL
  Filled 2014-02-25 (×5): qty 1

## 2014-02-25 SURGICAL SUPPLY — 37 items
APL SKNCLS STERI-STRIP NONHPOA (GAUZE/BANDAGES/DRESSINGS) ×1
BENZOIN TINCTURE PRP APPL 2/3 (GAUZE/BANDAGES/DRESSINGS) ×2 IMPLANT
BLADE SURG 10 STRL SS (BLADE) ×6 IMPLANT
CLAMP CORD UMBIL (MISCELLANEOUS) IMPLANT
CLOSURE WOUND 1/2 X4 (GAUZE/BANDAGES/DRESSINGS) ×1
CLOTH BEACON ORANGE TIMEOUT ST (SAFETY) ×3 IMPLANT
DRAPE LG THREE QUARTER DISP (DRAPES) IMPLANT
DRSG OPSITE POSTOP 4X10 (GAUZE/BANDAGES/DRESSINGS) ×3 IMPLANT
DURAPREP 26ML APPLICATOR (WOUND CARE) ×3 IMPLANT
ELECT REM PT RETURN 9FT ADLT (ELECTROSURGICAL) ×3
ELECTRODE REM PT RTRN 9FT ADLT (ELECTROSURGICAL) ×1 IMPLANT
EXTRACTOR VACUUM KIWI (MISCELLANEOUS) IMPLANT
GLOVE BIO SURGEON ST LM GN SZ9 (GLOVE) ×3 IMPLANT
GLOVE BIOGEL PI IND STRL 9 (GLOVE) ×1 IMPLANT
GLOVE BIOGEL PI INDICATOR 9 (GLOVE) ×2
GOWN STRL REUS W/TWL 2XL LVL3 (GOWN DISPOSABLE) ×3 IMPLANT
GOWN STRL REUS W/TWL LRG LVL3 (GOWN DISPOSABLE) ×3 IMPLANT
NDL HYPO 25X5/8 SAFETYGLIDE (NEEDLE) IMPLANT
NEEDLE HYPO 25X5/8 SAFETYGLIDE (NEEDLE) IMPLANT
NS IRRIG 1000ML POUR BTL (IV SOLUTION) ×3 IMPLANT
PACK C SECTION WH (CUSTOM PROCEDURE TRAY) ×3 IMPLANT
PAD OB MATERNITY 4.3X12.25 (PERSONAL CARE ITEMS) ×3 IMPLANT
RTRCTR C-SECT PINK 25CM LRG (MISCELLANEOUS) IMPLANT
RTRCTR C-SECT PINK 34CM XLRG (MISCELLANEOUS) IMPLANT
STRIP CLOSURE SKIN 1/2X4 (GAUZE/BANDAGES/DRESSINGS) ×1 IMPLANT
SUT MNCRL 0 VIOLET CTX 36 (SUTURE) ×2 IMPLANT
SUT MONOCRYL 0 CTX 36 (SUTURE) ×4
SUT VIC AB 0 CT1 27 (SUTURE) ×3
SUT VIC AB 0 CT1 27XBRD ANBCTR (SUTURE) ×1 IMPLANT
SUT VIC AB 2-0 CT1 27 (SUTURE) ×3
SUT VIC AB 2-0 CT1 TAPERPNT 27 (SUTURE) ×1 IMPLANT
SUT VIC AB 2-0 CTX 36 (SUTURE) ×2 IMPLANT
SUT VIC AB 4-0 KS 27 (SUTURE) ×3 IMPLANT
SYR BULB IRRIGATION 50ML (SYRINGE) IMPLANT
TOWEL OR 17X24 6PK STRL BLUE (TOWEL DISPOSABLE) ×3 IMPLANT
TRAY FOLEY CATH 14FR (SET/KITS/TRAYS/PACK) ×3 IMPLANT
WATER STERILE IRR 1000ML POUR (IV SOLUTION) ×3 IMPLANT

## 2014-02-25 NOTE — Brief Op Note (Signed)
02/25/2014  5:43 PM  PATIENT:  Sherri Woodward  28 y.o. female  PRE-OPERATIVE DIAGNOSIS:  PREVIOUS CESAREAN SECTION, pregnancy 39 wk, not for trial of labor  POST-OPERATIVE DIAGNOSIS:  PREVIOUS CESAREAN SECTION same  PROCEDURE:  Procedure(s): REPEAT CESAREAN SECTION (N/A)  SURGEON:  Surgeon(s) and Role:    * [name], MD - Primary  PHYSICIAN ASSISTANT:   ASSISTANTS: none   ANESTHESIA:   spinal  EBL:  Total I/O In: 3600 [I.V.:3600] Out: 250 [Urine:50; Blood:200]  BLOOD ADMINISTERED:none  DRAINS: Urinary Catheter (Foley)   LOCAL MEDICATIONS USED:  NONE  SPECIMEN:  No Specimen  DISPOSITION OF SPECIMEN:  placenta to L&D  COUNTS:  YES  TOURNIQUET:  * No tourniquets in log *  DICTATION: .Dragon Dictation  PLAN OF CARE: Admit to inpatient   PATIENT DISPOSITION:  PACU - hemodynamically stable.   Delay start of Pharmacological VTE agent (>24hrs) due to surgical blood loss or risk of bleeding: not applicable

## 2014-02-25 NOTE — OR Nursing (Signed)
Placenta to L/D 

## 2014-02-25 NOTE — Transfer of Care (Signed)
Immediate Anesthesia Transfer of Care Note  Patient: Sherri Woodward  Procedure(s) Performed: Procedure(s): REPEAT CESAREAN SECTION (N/A)  Patient Location: PACU  Anesthesia Type:Spinal  Level of Consciousness: awake, alert  and oriented  Airway & Oxygen Therapy: Patient Spontanous Breathing  Post-op Assessment: Report given to PACU RN and Post -op Vital signs reviewed and stable  Post vital signs: Reviewed and stable  Complications: No apparent anesthesia complications

## 2014-02-25 NOTE — Anesthesia Procedure Notes (Signed)
Spinal  Patient location during procedure: OR Start time: 02/25/2014 4:02 PM End time: 02/25/2014 4:06 PM Staffing Anesthesiologist: Leilani Able Performed by: anesthesiologist  Preanesthetic Checklist Completed: patient identified, surgical consent, pre-op evaluation, timeout performed, IV checked, risks and benefits discussed and monitors and equipment checked Spinal Block Patient position: sitting Prep: site prepped and draped and DuraPrep Patient monitoring: heart rate, cardiac monitor, continuous pulse ox and blood pressure Approach: midline Location: L3-4 Injection technique: single-shot Needle Needle type: Pencan  Needle gauge: 24 G Needle length: 9 cm Needle insertion depth: 7 cm Assessment Sensory level: T4

## 2014-02-25 NOTE — Op Note (Signed)
02/25/2014  5:43 PM  PATIENT:  Sherri Woodward  28 y.o. female  PRE-OPERATIVE DIAGNOSIS:  PREVIOUS CESAREAN SECTION, pregnancy 39 wk, not for trial of labor  POST-OPERATIVE DIAGNOSIS:  PREVIOUS CESAREAN SECTION same  PROCEDURE:  Procedure(s): REPEAT CESAREAN SECTION (N/A)  SURGEON:  Surgeon(s) and Role:    * [name], MD - Primary  PHYSICIAN ASSISTANT:   ASSISTANTS: none   ANESTHESIA:   spinal  EBL:  Total I/O In: 3600 [I.V.:3600] Out: 250 [Urine:50; Blood:200]  BLOOD ADMINISTERED:none  DRAINS: Urinary Catheter (Foley)   LOCAL MEDICATIONS USED:  NONE  SPECIMEN:  No Specimen  DISPOSITION OF SPECIMEN:  placenta to L&D  COUNTS:  YES  TOURNIQUET:  * No tourniquets in log *  DICTATION: .Dragon Dictation  PLAN OF CARE: Admit to inpatient   PATIENT DISPOSITION:  PACU - hemodynamically stable.   Delay start of Pharmacological VTE agent (>24hrs) due to surgical blood loss or risk of bleeding: not applicable  Details of procedure: Patient was taken to the operating room, prepped and draped for lower abdominal surgery. Transverse lower abdominal incision was repeated and the method of Pfannenstiel with excision of the old scar, a 20 cm x 2 cm strip of skin and fatty tissue. Transverse opening of the fascia was performed and peritoneal cavity opened in the midline, sharply. There were no intra-abdominal adhesions. Transverse lower uterine segment incision was made with Balfour bladder retractor in place. A clear amount of fluid was encountered the fetal vertex delivered vision a healthy female infant Apgars 8 and 9. See pediatricians details regarding infant care Cord was clamped placenta delivered in response to her day uterine massage, and membranes removed, and the uterus was quite firm and dry 2 layer closure the uterus was performed, single-layer running locking 0 Monocryl followed by continuous running 0 Monocryl. Hemostasis good. It was irrigated, and peritoneum  closed with 2-0 Vicryl, fascia closed with 0 Vicryl, subcutaneous tissues approximated with 3 interrupted sutures of 2-0 Vicryl and then subcuticular 4-0 Vicryl close the skin with good tissue approximation and Steri-Strips applied sponge and needle counts were correct

## 2014-02-25 NOTE — Anesthesia Preprocedure Evaluation (Signed)
Anesthesia Evaluation  Patient identified by MRN, date of birth, ID band Patient awake    Reviewed: Allergy & Precautions, H&P , NPO status , Patient's Chart, lab work & pertinent test results  Airway Mallampati: II TM Distance: >3 FB Neck ROM: full    Dental no notable dental hx.    Pulmonary Current Smoker,    Pulmonary exam normal       Cardiovascular negative cardio ROS      Neuro/Psych negative neurological ROS  negative psych ROS   GI/Hepatic negative GI ROS, Neg liver ROS,   Endo/Other  Morbid obesity  Renal/GU negative Renal ROS     Musculoskeletal   Abdominal (+) + obese,   Peds  Hematology negative hematology ROS (+)   Anesthesia Other Findings   Reproductive/Obstetrics (+) Pregnancy                           Anesthesia Physical Anesthesia Plan  ASA: III  Anesthesia Plan: Spinal   Post-op Pain Management:    Induction:   Airway Management Planned:   Additional Equipment:   Intra-op Plan:   Post-operative Plan:   Informed Consent: I have reviewed the patients History and Physical, chart, labs and discussed the procedure including the risks, benefits and alternatives for the proposed anesthesia with the patient or authorized representative who has indicated his/her understanding and acceptance.     Plan Discussed with: CRNA and Surgeon  Anesthesia Plan Comments:         Anesthesia Quick Evaluation  

## 2014-02-25 NOTE — Anesthesia Postprocedure Evaluation (Signed)
Anesthesia Post Note  Patient: Sherri Woodward  Procedure(s) Performed: Procedure(s) (LRB): REPEAT CESAREAN SECTION (N/A)  Anesthesia type: Spinal  Patient location: PACU  Post pain: Pain level controlled  Post assessment: Post-op Vital signs reviewed  Last Vitals:  Filed Vitals:   02/25/14 1331  BP: 114/72  Pulse: 74  Temp: 37 C  Resp: 20    Post vital signs: Reviewed  Level of consciousness: awake  Complications: No apparent anesthesia complications

## 2014-02-25 NOTE — Interval H&P Note (Signed)
History and Physical Interval Note:  02/25/2014 3:35 PM  Sherri Woodward  has presented today for surgery, with the diagnosis of PREVIOUS CESAREAN SECTION  The various methods of treatment have been discussed with the patient and family. After consideration of risks, benefits and other options for treatment, the patient has consented to  Procedure(s): REPEAT CESAREAN SECTION (N/A) as a surgical intervention .  The patient's history has been reviewed, patient examined, no change in status, stable for surgery.  I have reviewed the patient's chart and labs.  Questions were answered to the patient's satisfaction.     [name]

## 2014-02-26 LAB — CBC
HEMATOCRIT: 29.2 % — AB (ref 36.0–46.0)
Hemoglobin: 9.9 g/dL — ABNORMAL LOW (ref 12.0–15.0)
MCH: 31.6 pg (ref 26.0–34.0)
MCHC: 33.9 g/dL (ref 30.0–36.0)
MCV: 93.3 fL (ref 78.0–100.0)
Platelets: 226 10*3/uL (ref 150–400)
RBC: 3.13 MIL/uL — AB (ref 3.87–5.11)
RDW: 13.4 % (ref 11.5–15.5)
WBC: 10.2 10*3/uL (ref 4.0–10.5)

## 2014-02-26 NOTE — Progress Notes (Signed)
Subjective: Postpartum Day #1: Cesarean Delivery Patient reports tolerating PO.  Is breast and bottlefeeding; plans on Mirena for contraception. Foley removed this AM but has not had the opportunity to void yet.  Objective: Vital signs in last 24 hours: Temp:  [97.3 F (36.3 C)-98.6 F (37 C)] 98.3 F (36.8 C) (08/15 0400) Pulse Rate:  [52-74] 66 (08/15 0400) Resp:  [16-20] 18 (08/15 0400) BP: (89-119)/(49-73) 91/49 mmHg (08/15 0400) SpO2:  [97 %-100 %] 100 % (08/15 0400) Weight:  [118.026 kg (260 lb 3.2 oz)] 118.026 kg (260 lb 3.2 oz) (08/14 1900)  Physical Exam:  General: alert, cooperative and mild distress Heart: RRR Lungs: nl effort Lochia: appropriate Uterine Fundus: firm Incision: honeycomb intact; sm stains, unchanged DVT Evaluation: No evidence of DVT seen on physical exam.   Recent Labs  02/23/14 1025 02/26/14 0548  HGB 11.2* 9.9*  HCT 32.8* 29.2*    Assessment/Plan: Status post Cesarean section. Doing well postoperatively.  Continue current care. Anticipate d/c in AM 8/16  SHAW, KIMBERLY CNM 02/26/2014, 7:54 AM

## 2014-02-26 NOTE — Lactation Note (Signed)
This note was copied from the chart of Sherri Woodward. Lactation Consultation Note  Patient Name: Sherri Woodward [id number] Date: 02/26/2014 Reason for consult: Other (Comment) (formula exclusion)   Maternal Data Formula Feeding for Exclusion: Yes Reason for exclusion: Mother's choice to formula feed on admision  Feeding    LATCH Score/Interventions                      Lactation Tools Discussed/Used     Consult Status      [name] 02/26/2014, 11:52 AM

## 2014-02-26 NOTE — Addendum Note (Signed)
Addendum created 02/26/14 0920 by Lincoln BrighamAngela Draughon Collier Bohnet, CRNA   Modules edited: Notes Section   Notes Section:  File: [id number]

## 2014-02-26 NOTE — Anesthesia Postprocedure Evaluation (Signed)
Anesthesia Post Note  Patient: Sherri Woodward  Procedure(s) Performed: Procedure(s) (LRB): REPEAT CESAREAN SECTION (N/A)  Anesthesia type: Spinal  Patient location: Mother/Baby  Post pain: Pain level controlled  Post assessment: Post-op Vital signs reviewed  Last Vitals:  Filed Vitals:   02/26/14 0800  BP: 91/56  Pulse: 56  Temp:   Resp: 18    Post vital signs: Reviewed  Level of consciousness:alert  Complications: No apparent anesthesia complications

## 2014-02-26 NOTE — Progress Notes (Signed)
Per Phlebotomy, Pt refused blood draw for placenta donation.  Clinton SawyerWilliamson, [place], RN, BSN 6:07 AM

## 2014-02-27 MED ORDER — OXYCODONE-ACETAMINOPHEN 5-325 MG PO TABS: 1.0000 | ORAL_TABLET | ORAL | Status: DC | PRN

## 2014-02-27 MED ORDER — IBUPROFEN 600 MG PO TABS: 600.0000 mg | ORAL_TABLET | Freq: Four times a day (QID) | ORAL | Status: DC

## 2014-02-27 NOTE — Discharge Instructions (Signed)
[facility] Ask any questions about feeding, diapering, and baby care before you leave the hospital. Ask again if you do not understand. Ask when you need to see the doctor again. There are several things you must have before your baby comes home.  Infant car seat.  Crib.  Do not let your baby sleep in a bed with you or anyone else.  If you do not have a bed for your baby, ask the doctor what you can use that will be safe for the baby to sleep in. Infant feeding supplies:  6 to 8 bottles (8 ounce size).  6 to 8 nipples.  Measuring cup.  Measuring tablespoon.  Bottle brush.  Sterilizer (or use any large pan or kettle with a lid).  Formula that contains iron.  A way to boil and cool water. Breastfeeding supplies:  Breast pump.  Nipple cream. Clothing:  24 to 36 cloth diapers and waterproof diaper covers or a box of disposable diapers. You may need as many as 10 to 12 diapers per day.  3 onesies (other clothing will depend on the time of year and the weather).  3 receiving blankets.  3 baby pajamas or gowns.  3 bibs. Bath equipment:  Mild soap.  Petroleum jelly. No baby oil or powder.  Soft cloth towel and washcloth.  Cotton balls.  Separate bath basin for baby. Only sponge bathe until umbilical cord and circumcision are healed. Other supplies:  Thermometer and bulb syringe (ask the hospital to send them home with you). Ask your doctor about how you should take your baby's temperature.  One to two pacifiers. Prepare for an emergency:  Know how to get to the hospital and know where to admit your baby.  Put all doctor numbers near your house phone and in your cell phone if you have one. Prepare your family:  Talk with siblings about the baby coming home and how they feel about it.  Decide how you want to handle visitors and other family members.  Take offers for help with the baby. You will need time to adjust. Know when to call the  doctor.  GET HELP RIGHT AWAY IF:  Your baby's temperature is greater than 100.56F (38C).  The soft spot on your baby's head starts to bulge.  Your baby is crying with no tears or has no wet diapers for 6 hours.  Your baby has rapid breathing.  Your baby is not as alert. Document Released: 06/13/2008 Document Revised: 11/15/2013 Document Reviewed: 09/20/2010 [facility] Patient Information 2015 [place]. This information is not intended to replace advice given to you by your health care provider. Make sure you discuss any questions you have with your health care provider. Cesarean Delivery  Cesarean delivery is the birth of a baby through a cut (incision) in the abdomen and womb (uterus).  LET [facility] PROVIDER KNOW ABOUT:  All medicines you are taking, including vitamins, herbs, eye drops, creams, and over-the-counter medicines.  Previous problems you or members of your family have had with the use of anesthetics.  Any blood disorders you have.  Previous surgeries you have had.  Medical conditions you have.  Any allergies you have.  Complicationsinvolving the pregnancy. RISKS AND COMPLICATIONS  Generally, this is a safe procedure. However, as with any procedure, complications can occur. Possible complications include:  Bleeding.  Infection.  Blood clots.  Injury to surrounding organs.  Problems with anesthesia.  Injury to the baby. BEFORE THE PROCEDURE  You may be given an antacid medicine to drink. This will prevent acid contents in your stomach from going into your lungs if you vomit during the surgery.  You may be given an antibiotic medicine to prevent infection. PROCEDURE   Hair may be removed from your pubic area and your lower abdomen. This is to prevent infection in the incision site.  A tube (Foley catheter) will be placed in your bladder to drain your urine from your bladder into a bag. This keeps your bladder empty during surgery.  An  IV tube will be placed in your vein.  You may be given medicine to numb the lower half of your body (regional anesthetic). If you were in labor, you may have already had an epidural in place which can be used in both labor and cesarean delivery. You may possibly be given medicine to make you sleep (general anesthetic) though this is not as common.  An incision will be made in your abdomen that extends to your uterus. There are 2 basic kinds of incisions:  The horizontal (transverse) incision. Horizontal incisions are from side to side and are used for most routine cesarean deliveries.  The vertical incision. The vertical incision is from the top of the abdomen to the bottom and is less commonly used. It is often done for women who have a serious complication (extreme prematurity) or under emergency situations.  The horizontal and vertical incisions may both be used at the same time. However, this is very uncommon.  An incision is then made in your uterus to deliver the baby.  Your baby will then be delivered.  Both incisions are then closed with absorbable stitches. AFTER THE PROCEDURE   If you were awake during the surgery, you will see your baby right away. If you were asleep, you will see your baby as soon as you are awake.  You may breastfeed your baby after surgery.  You may be able to get up and walk the same day as the surgery. If you need to stay in bed for a period of time, you will receive help to turn, cough, and take deep breaths after surgery. This helps prevent lung problems such as pneumonia.  Do not get out of bed alone the first time after surgery. You will need help getting out of bed until you are able to do this by yourself.  You may be able to shower the day after your cesarean delivery. After the bandage (dressing) is taken off the incision site, a nurse will assist you to shower if you would like help.  You will have pneumatic compression hose placed on your  lower legs. This is done to prevent blood clots. When you are up and walking regularly, they will no longer be necessary.  Do not cross your legs when you sit.  Save any blood clots that you pass. If you pass a clot while on the toilet, do not flush it. Call for the nurse. Tell the nurse if you think you are bleeding too much or passing too many clots.  You will be given medicine as needed. Let your health care providers know if you are hurting. You may also be given an antibiotic to prevent an infection.  Your IV tube will be taken out when you are drinking a reasonable amount of fluids. The Foley catheter is taken out when you are up and walking.  If your blood type is Rh negative and your baby's blood type is Rh  positive, you will be given a shot of anti-D immune globulin. This shot prevents you from having Rh problems with a future pregnancy. You should get the shot even if you had your tubes tied (tubal ligation).  If you are allowed to take the baby for a walk, place the baby in the bassinet and push it. Do not carry your baby in your arms. Document Released: 07/01/2005 Document Revised: 04/21/2013 Document Reviewed: 01/20/2013 [facility] Patient Information 2015 [place], [place]. This information is not intended to replace advice given to you by your health care provider. Make sure you discuss any questions you have with your health care provider.

## 2014-02-27 NOTE — Discharge Summary (Signed)
Obstetric Discharge Summary Reason for Admission: cesarean section Prenatal Procedures: NST Intrapartum Procedures: cesarean: low cervical, transverse Postpartum Procedures: none Complications-Operative and Postpartum: none Hemoglobin  Date Value Ref Range Status  02/26/2014 9.9* 12.0 - 15.0 g/dL Final     HCT  Date Value Ref Range Status  02/26/2014 29.2* 36.0 - 46.0 % Final   PERL ESPER is a 28 y.o. female presenting for repeat cesarean section. She is [redacted] weeks gestation.. she has declined trial of labor and requests repeat cesarean section and has been counseled through our office.  Prenatal course has been straightforward and, with patient on acyclovir suppression for HSV-2, and had treatment early in the pregnancy for group B strep urinary tract infection. She has had appropriate weight gain and fundal height growth Pt. Admitted and brought to the OR for repeat cesarean delivery. Her intraoperative course proceeded without incident, and her postpartum course remained uncomplicated. She is now ambulating, tolerating po, +Bm, pain well controlled, and with minimal bleeding. She is stable and ready for discharge. She desires to breast and bottle feed, and she desires Mirena for LARC at her follow up appointment.   Physical Exam:  General: alert, cooperative and no distress Lochia: appropriate Uterine Fundus: firm Incision: healing well, no significant drainage, no dehiscence, no significant erythema DVT Evaluation: No evidence of DVT seen on physical exam. No cords or calf tenderness.  Discharge Diagnoses: Term Pregnancy-delivered  Discharge Information: Date: 02/27/2014 Activity: unrestricted and pelvic rest Diet: routine Medications: PNV, Ibuprofen and Percocet Condition: stable Instructions: refer to practice specific booklet Discharge to: home Follow-up Information   Follow up with [facility]. Schedule an appointment as soon as possible for a visit in 4 weeks.  (Postpartum Follow Up )    Specialty:  Obstetrics and Gynecology   Contact information:   [address] [place] [address] [phone]      Newborn Data: Live born female  Birth Weight: 6 lb 9.3 oz (2985 g) APGAR: 8, 9  Home with mother.  Chevon Laufer G 02/27/2014, 7:17 AM

## 2014-02-27 NOTE — Progress Notes (Signed)
Clinical Social Work Department PSYCHOSOCIAL ASSESSMENT - MATERNAL/CHILD 02/27/2014  Patient:  ANALISHA, BOURDIER  Account Number:  [id number]  Admit Date:  02/25/2014  Marjo Bicker Name:   Mechele Dawley    Clinical Social Worker:  Michaela Broski, LCSW   Date/Time:  02/27/2014 10:15 AM  Date Referred:  02/26/2014   Referral source  Central Nursery     Referred reason  Substance Abuse   Other referral source:    I:  FAMILY / HOME ENVIRONMENT Child's legal guardian:  PARENT  Guardian - Name Guardian - Age Guardian - Address  TAMANTHA, SEDBERRY [address]  [place]. [place], [place] [address]  Scales, Jomarie Longs     Other household support members/support persons Other support:   Maternal grandparents    II  PSYCHOSOCIAL DATA Information Source:    Event organiser Employment:   Mother is employed and reported plans to return to work   Surveyor, quantity resources:  [employer]:   Clinical biochemist  [employer]   School / Grade:   Maternity Care Coordinator / Child Services Coordination / Early Interventions:  Cultural issues impacting care:    III  STRENGTHS Strengths  Supportive family/friends  Home prepared for Child (including basic supplies)  Adequate Resources   Strength comment:    IV  RISK FACTORS AND CURRENT PROBLEMS Current Problem:     Risk Factor & Current Problem Patient Issue Family Issue Risk Factor / Current Problem Comment  Substance Abuse Y N Hx of marijuana use during pregnancy    V  SOCIAL WORK ASSESSMENT Acknowledged order for Social Work consult to assess mother's history of marijuana.  Mother is a single parent with one other dependent age 39.  Informed that FOB is unsupportive.  She is currently residing with maternal grandparents.  Mother acknowledged hx of marijuana.  "I told my doctor it's something I just like to do".   She denies any other illicit drug use.  Mother notes that her occasional use of marijuana does not interfere  with her ability to care for her children.  She denies any hx of mental illness.  Mother was informed of the hospital's drug screen policy.  UDS on newborn was negative.    No acute social concerns noted at this time.  Mother informed of social work Surveyor, mining.      VI SOCIAL WORK PLAN Social Work Plan  No Further Intervention Required / No Barriers to Discharge   Type of pt/family education:   If child protective services report - county:   If child protective services report - date:   Information/referral to community resources comment:   Other social work plan:   Will continue to monitor drug screen.

## 2014-02-28 ENCOUNTER — Encounter (HOSPITAL_COMMUNITY): Payer: Self-pay | Admitting: Obstetrics and Gynecology

## 2014-03-04 ENCOUNTER — Ambulatory Visit (INDEPENDENT_AMBULATORY_CARE_PROVIDER_SITE_OTHER): Payer: Medicaid Other | Admitting: Obstetrics and Gynecology

## 2014-03-04 ENCOUNTER — Encounter: Payer: Self-pay | Admitting: Obstetrics and Gynecology

## 2014-03-04 VITALS — BP 120/82 | Ht 63.0 in | Wt 266.0 lb

## 2014-03-04 DIAGNOSIS — Z9889 Other specified postprocedural states: Secondary | ICD-10-CM

## 2014-03-04 NOTE — Progress Notes (Signed)
This chart was scribed by Chestine Spore, Medical Scribe, for Dr. Christin Bach on 03/04/14 at 10:02 AM. This chart was reviewed by Dr. Christin Bach for accuracy.     Subjective:  Sherri Woodward is a 28 y.o. female who presents to the clinic 1 weeks status post Repeat Cesarean Section.  She states that she is here today for an incision check. She states that there is a little swelling at the wound site. Pt denies any problems or concerns at this time.  Review of Systems Negative  She has been eating a regular diet without difficulty.   Bowel movements are normal. The patient is not having any pain.  Objective:  BP 120/82  Ht 5\' 3"  (1.6 m)  Wt 266 lb (120.657 kg)  BMI 47.13 kg/m2  LMP 05/28/2013  Breastfeeding? No General:Well developed, well nourished.  No acute distress. Abdomen: Bowel sounds normal, soft, non-tender. Pelvic Exam:    External Genitalia:  Normal.    Vagina: Normal    Bimanual: Normal    Cervix: Normal    Uterus: Normal    Adnexa: Normal  Incision(s):   Healing well, no drainage, no erythema, no hernia, no swelling, no dehiscence, incision well approximated.   Assessment:  Post-Op 1 weeks s/p Repeat Cesarean Section   1. Well healing incision and clean.  Doing well postoperatively.   Plan:  1.Wound care discussed. Birth control mirena after 4 weeks.  2. .Continue any current medications. 3. Activity restrictions: routine post-op 4. return to work: not applicable. 5. Follow up in 4 weeks.

## 2014-03-28 NOTE — Discharge Summary (Signed)
Attestation of Attending Supervision of Advanced Practitioner: Evaluation and management procedures were performed by the PA/NP/CNM/OB Fellow under my supervision/collaboration. Chart reviewed and agree with management and plan.  Merion Caton V 03/28/2014 6:39 PM   

## 2014-03-31 ENCOUNTER — Ambulatory Visit (INDEPENDENT_AMBULATORY_CARE_PROVIDER_SITE_OTHER): Payer: Medicaid Other | Admitting: Advanced Practice Midwife

## 2014-03-31 NOTE — Progress Notes (Signed)
  Sherri Woodward is a 28 y.o. who presents for a postpartum visit. She is 4 weeks postpartum following a low cervical transverse Cesarean section. I have fully reviewed the prenatal and intrapartum course. The delivery was at 39 gestational weeks.  Anesthesia: spinal. Postpartum course has been uneventful. Baby's course has been uneventful. Baby is feeding by bottle. Bleeding: no bleeding. Bowel function is normal. Bladder function is normal. Patient is not sexually active. Contraception method is IUD. Postpartum depression screening: negative. 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  Still has some back pain where spinal was.  Neurological: Negative for dizziness and headaches   Objective:     Filed Vitals:   03/31/14 1359  BP: 130/90   General:  alert, cooperative and no distress   Breasts:  negative  Lungs: clear to auscultation bilaterally  Heart:  regular rate and rhythm  Abdomen: Soft, nontender.  Incision healing well   Vulva:  normal  Vagina: normal vagina  Cervix:  closed  Corpus: Well involuted     Rectal Exam: no hemorrhoids        Assessment:    normal postpartum exam.  Plan:    1. Contraception: IUD 2. Follow up in: 2 weeks for IUD or as needed.

## 2014-04-14 ENCOUNTER — Ambulatory Visit: Payer: Medicaid Other | Admitting: Advanced Practice Midwife

## 2014-04-20 ENCOUNTER — Ambulatory Visit (INDEPENDENT_AMBULATORY_CARE_PROVIDER_SITE_OTHER): Payer: Medicaid Other | Admitting: Advanced Practice Midwife

## 2014-04-20 ENCOUNTER — Encounter: Payer: Self-pay | Admitting: Advanced Practice Midwife

## 2014-04-20 VITALS — BP 130/90 | Wt 259.0 lb

## 2014-04-20 DIAGNOSIS — Z3202 Encounter for pregnancy test, result negative: Secondary | ICD-10-CM

## 2014-04-20 DIAGNOSIS — Z30430 Encounter for insertion of intrauterine contraceptive device: Secondary | ICD-10-CM | POA: Insufficient documentation

## 2014-04-20 LAB — POCT URINE PREGNANCY: Preg Test, Ur: NEGATIVE

## 2014-04-20 NOTE — Progress Notes (Signed)
Sherri Woodward is a 28 y.o. year old African American female Gravida 2 Para 2  who presents for placement of a Lyletta IUD. She has not been sexually active since the birth of her child and her pregnancy test today is negative.    The risks and benefits of the method and placement have been thouroughly reviewed with the patient and all questions were answered.  Specifically the patient is aware of failure rate of 07/998, expulsion of the IUD and of possible perforation.  The patient is aware of irregular bleeding due to the method and understands the incidence of irregular bleeding diminishes with time. She is also aware that as of today, the Candise Che is approved for 3 years, with the expectation (although not guarantee) that it will receive 5 year, and most likely 7 year, approval).  Time out was performed.  A Graves speculum was placed.  The cervix was prepped using Betadine. The uterus was found to be neutral and it sounded to 8 cm. There was difficulty entering the os d/t a flap of skin covering the os, but Dr. Despina Hidden was able to get around it after dilating the cx more.  The cervix was grasped with a tenaculum and the IUD was inserted to 8 cm.  It was pulled back 1 cm and the IUD was disengaged.  The strings were trimmed to 3 cm.  Sonogram was performed and the proper placement of the IUD was verified.  The patient was instructed on signs and symptoms of infection and to check for the strings after each menses or each month.  The patient is to refrain from intercourse for 3 days.  The patient is scheduled for a return appointment after her first menses or 4 weeks.  Sherri Woodward,Sherri Woodward 04/20/2014 9:45 AM

## 2014-05-16 ENCOUNTER — Encounter: Payer: Self-pay | Admitting: Advanced Practice Midwife

## 2014-05-18 ENCOUNTER — Telehealth: Payer: Self-pay | Admitting: Advanced Practice Midwife

## 2014-05-18 ENCOUNTER — Ambulatory Visit: Payer: Medicaid Other | Admitting: Advanced Practice Midwife

## 2014-05-18 MED ORDER — NAPROXEN 500 MG PO TABS: 500.0000 mg | ORAL_TABLET | Freq: Two times a day (BID) | ORAL | Status: DC

## 2014-05-18 NOTE — Telephone Encounter (Signed)
Pt states having a lot of cramping since got the  IUD (04/20/14) "stomach in knots, hurts, abnormal vaginal bleeding." Please advise.

## 2014-05-18 NOTE — Telephone Encounter (Signed)
Left message that cramping is common SE that usually works itself out with time.  Naproxyn sent to pharmacy.  If it doesn't help, come in for IUD check to make sure it is not displaced.  Megace prn if bleeding gets too heavy.

## 2014-10-12 ENCOUNTER — Other Ambulatory Visit (HOSPITAL_COMMUNITY): Payer: Self-pay | Admitting: Nurse Practitioner

## 2014-10-12 DIAGNOSIS — R102 Pelvic and perineal pain: Secondary | ICD-10-CM

## 2014-10-14 ENCOUNTER — Other Ambulatory Visit (HOSPITAL_COMMUNITY): Payer: Self-pay | Admitting: Nurse Practitioner

## 2014-10-14 DIAGNOSIS — R102 Pelvic and perineal pain: Secondary | ICD-10-CM

## 2014-10-17 ENCOUNTER — Ambulatory Visit (HOSPITAL_COMMUNITY): Admission: RE | Admit: 2014-10-17 | Payer: Medicaid Other | Source: Ambulatory Visit

## 2014-10-21 ENCOUNTER — Ambulatory Visit (HOSPITAL_COMMUNITY): Admission: RE | Admit: 2014-10-21 | Payer: Medicaid Other | Source: Ambulatory Visit

## 2014-10-21 ENCOUNTER — Ambulatory Visit (HOSPITAL_COMMUNITY)
Admission: RE | Admit: 2014-10-21 | Discharge: 2014-10-21 | Disposition: A | Discharge status: Home / Self Care | Payer: Medicaid Other | Source: Ambulatory Visit | Attending: Nurse Practitioner | Admitting: Nurse Practitioner

## 2014-10-21 DIAGNOSIS — R102 Pelvic and perineal pain: Secondary | ICD-10-CM | POA: Insufficient documentation

## 2015-06-13 ENCOUNTER — Encounter: Payer: Self-pay | Admitting: *Deleted

## 2015-06-15 ENCOUNTER — Ambulatory Visit: Payer: Medicaid Other | Admitting: Obstetrics and Gynecology

## 2015-06-19 ENCOUNTER — Ambulatory Visit: Payer: Medicaid Other | Admitting: Obstetrics and Gynecology

## 2015-09-15 ENCOUNTER — Encounter (HOSPITAL_COMMUNITY): Payer: Self-pay | Admitting: *Deleted

## 2015-09-15 ENCOUNTER — Emergency Department (HOSPITAL_COMMUNITY)
Admission: EM | Admit: 2015-09-15 | Discharge: 2015-09-15 | Disposition: A | Discharge status: Home / Self Care | Payer: Medicaid Other | Attending: Emergency Medicine | Admitting: Emergency Medicine

## 2015-09-15 DIAGNOSIS — R05 Cough: Secondary | ICD-10-CM | POA: Diagnosis present

## 2015-09-15 DIAGNOSIS — F17210 Nicotine dependence, cigarettes, uncomplicated: Secondary | ICD-10-CM | POA: Diagnosis not present

## 2015-09-15 DIAGNOSIS — B349 Viral infection, unspecified: Secondary | ICD-10-CM | POA: Diagnosis not present

## 2015-09-15 NOTE — ED Provider Notes (Signed)
CSN: [id number]     Arrival date & time 09/15/15  0719 History   First MD Initiated Contact with Patient 09/15/15 [phone]     Chief Complaint  Patient presents with  . Cough     (Consider location/radiation/quality/duration/timing/severity/associated sxs/prior Treatment) Patient is a 30 y.o. female presenting with cough. The history is provided by the patient. No language interpreter was used.  Cough Cough characteristics:  Productive Sputum characteristics:  Nondescript Severity:  Moderate Onset quality:  Gradual Timing:  Constant Progression:  Worsening Chronicity:  New Smoker: no   Context: sick contacts   Relieved by:  Nothing Worsened by:  Nothing tried Ineffective treatments:  None tried Associated symptoms: fever and rhinorrhea     Past Medical History  Diagnosis Date  . Pregnancy   . Smoker 08/10/2013  . 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 Medical history non-contributory    Past Surgical History  Procedure Laterality Date  . Cesarean section    . Tongue surgery      due to accident as child  . Cesarean section N/A 02/25/2014    Procedure: REPEAT CESAREAN SECTION;  Surgeon: [name], MD;  Location: [facility];  Service: Obstetrics;  Laterality: N/A;   Family History  Problem Relation Age of Onset  . Hypertension Mother   . Stroke Father   . Heart Problems Father   . Hypertension Maternal Grandfather   . Kidney disease Maternal Grandfather    Social History  Substance Use Topics  . Smoking status: Current Some Day Smoker -- 0.25 packs/day    Types: Cigarettes  . Smokeless tobacco: Never Used  . Alcohol Use: Yes     Comment: occ.    OB History    Gravida Para Term Preterm AB TAB SAB Ectopic Multiple Living   2 2 2       2      Review of Systems  Constitutional: Positive for fever.  HENT: Positive for rhinorrhea.   Respiratory: Positive for cough.   All other systems reviewed and are  negative.     Allergies  Review of patient's allergies indicates no known allergies.  Home Medications   Prior to Admission medications   Medication Sig Start Date End Date Taking? Authorizing Provider  levonorgestrel (LILETTA, 52 MG,) 18.6 MCG/DAY IUD IUD 1 each by Intrauterine route once.    Historical Provider, MD  naproxen (NAPROSYN) 500 MG tablet Take 1 tablet (500 mg total) by mouth 2 (two) times daily with a meal. 05/18/14   Jacklyn Shell, CNM   BP 134/98 mmHg  Pulse 84  Temp(Src) 99.1 F (37.3 C) (Oral)  Resp 16  Ht 5\' 4"  (1.626 m)  Wt 106.595 kg  BMI 40.32 kg/m2  SpO2 100%  LMP 09/01/2015 Physical Exam  Constitutional: She is oriented to person, place, and time. She appears well-developed and well-nourished.  HENT:  Head: Normocephalic and atraumatic.  Eyes: Conjunctivae and EOM are normal. Pupils are equal, round, and reactive to light.  Neck: Normal range of motion.  Cardiovascular: Normal rate and normal heart sounds.   Pulmonary/Chest: Effort normal and breath sounds normal.  Abdominal: Soft. She exhibits no distension.  Musculoskeletal: Normal range of motion.  Neurological: She is alert and oriented to person, place, and time.  Skin: Skin is warm.  Psychiatric: She has a normal mood and affect.  Nursing note and vitals reviewed.   ED Course  Procedures (including critical care time) Labs Review Labs  Reviewed - No data to display  Imaging Review No results found. I have personally reviewed and evaluated these images and lab results as part of my medical decision-making.   EKG Interpretation None      MDM    Final diagnoses:  Viral illness    Pt counseled on influenza, tylenol for fever, An After Visit Summary was printed and given to the patient.    Lonia Skinner [place], PA-C 09/15/15 1610  Bethann Berkshire, MD 09/15/15 1325

## 2015-09-15 NOTE — ED Notes (Signed)
Pt has had cough, nasal congestion, and feeling of being tired for around 1 week. Pt denies any n/v/d.

## 2015-09-15 NOTE — Discharge Instructions (Signed)
Viral Infections °A viral infection can be caused by different types of viruses. Most viral infections are not serious and resolve on their own. However, some infections may cause severe symptoms and may lead to further complications. °SYMPTOMS °Viruses can frequently cause: °· Minor sore throat. °· Aches and pains. °· Headaches. °· Runny nose. °· Different types of rashes. °· Watery eyes. °· Tiredness. °· Cough. °· Loss of appetite. °· Gastrointestinal infections, resulting in nausea, vomiting, and diarrhea. °These symptoms do not respond to antibiotics because the infection is not caused by bacteria. However, you might catch a bacterial infection following the viral infection. This is sometimes called a "superinfection." Symptoms of such a bacterial infection may include: °· Worsening sore throat with pus and difficulty swallowing. °· Swollen neck glands. °· Chills and a high or persistent fever. °· Severe headache. °· Tenderness over the sinuses. °· Persistent overall ill feeling (malaise), muscle aches, and tiredness (fatigue). °· Persistent cough. °· Yellow, green, or brown mucus production with coughing. °HOME CARE INSTRUCTIONS  °· Only take over-the-counter or prescription medicines for pain, discomfort, diarrhea, or fever as directed by your caregiver. °· Drink enough water and fluids to keep your urine clear or pale yellow. Sports drinks can provide valuable electrolytes, sugars, and hydration. °· Get plenty of rest and maintain proper nutrition. Soups and broths with crackers or rice are fine. °SEEK IMMEDIATE MEDICAL CARE IF:  °· You have severe headaches, shortness of breath, chest pain, neck pain, or an unusual rash. °· You have uncontrolled vomiting, diarrhea, or you are unable to keep down fluids. °· You or your child has an oral temperature above 102° F (38.9° C), not controlled by medicine. °· Your baby is older than 3 months with a rectal temperature of 102° F (38.9° C) or higher. °· Your baby is 3  months old or younger with a rectal temperature of 100.4° F (38° C) or higher. °MAKE SURE YOU:  °· Understand these instructions. °· Will watch your condition. °· Will get help right away if you are not doing well or get worse. °  °This information is not intended to replace advice given to you by your health care provider. Make sure you discuss any questions you have with your health care provider. °  °Document Released: 04/10/2005 Document Revised: 09/23/2011 Document Reviewed: 12/07/2014 °Elsevier Interactive Patient Education ©2016 [employer] Inc[employer] ° °

## 2015-10-06 ENCOUNTER — Encounter (HOSPITAL_COMMUNITY): Payer: Self-pay | Admitting: *Deleted

## 2015-10-06 ENCOUNTER — Emergency Department (HOSPITAL_COMMUNITY)
Admission: EM | Admit: 2015-10-06 | Discharge: 2015-10-06 | Disposition: A | Discharge status: Home / Self Care | Payer: Medicaid Other | Attending: Emergency Medicine | Admitting: Emergency Medicine

## 2015-10-06 ENCOUNTER — Emergency Department (HOSPITAL_COMMUNITY): Payer: Medicaid Other

## 2015-10-06 DIAGNOSIS — S93602A Unspecified sprain of left foot, initial encounter: Secondary | ICD-10-CM | POA: Diagnosis not present

## 2015-10-06 DIAGNOSIS — F17210 Nicotine dependence, cigarettes, uncomplicated: Secondary | ICD-10-CM | POA: Insufficient documentation

## 2015-10-06 DIAGNOSIS — Y999 Unspecified external cause status: Secondary | ICD-10-CM | POA: Diagnosis not present

## 2015-10-06 DIAGNOSIS — Y929 Unspecified place or not applicable: Secondary | ICD-10-CM | POA: Insufficient documentation

## 2015-10-06 DIAGNOSIS — X58XXXA Exposure to other specified factors, initial encounter: Secondary | ICD-10-CM | POA: Diagnosis not present

## 2015-10-06 DIAGNOSIS — S99922A Unspecified injury of left foot, initial encounter: Secondary | ICD-10-CM | POA: Diagnosis present

## 2015-10-06 DIAGNOSIS — Y9301 Activity, walking, marching and hiking: Secondary | ICD-10-CM | POA: Diagnosis not present

## 2015-10-06 MED ORDER — OXYCODONE-ACETAMINOPHEN 5-325 MG PO TABS
1.0000 | ORAL_TABLET | Freq: Once | ORAL | Status: AC
  Administered 2015-10-06: 1 via ORAL
  Filled 2015-10-06: qty 1

## 2015-10-06 MED ORDER — IBUPROFEN 800 MG PO TABS: 800.0000 mg | ORAL_TABLET | Freq: Three times a day (TID) | ORAL | Status: DC | PRN

## 2015-10-06 MED ORDER — OXYCODONE-ACETAMINOPHEN 5-325 MG PO TABS: 1.0000 | ORAL_TABLET | Freq: Four times a day (QID) | ORAL | Status: DC | PRN

## 2015-10-06 MED ORDER — IBUPROFEN 800 MG PO TABS
800.0000 mg | ORAL_TABLET | Freq: Once | ORAL | Status: AC
  Administered 2015-10-06: 800 mg via ORAL
  Filled 2015-10-06: qty 1

## 2015-10-06 NOTE — ED Provider Notes (Signed)
TIME SEEN: 6:20 AM  CHIEF COMPLAINT: Left foot injury  HPI: Pt is a 30 y.o. female with no significant past medical history who presents to the emergency department with left foot pain. States that she was walking and accidentally twisted her left foot. Pain has gotten worse over the course of the evening and she is unable to bear weight. She did not fall to the ground or struck her head. No other injury. No numbness or focal weakness.  ROS: See HPI Constitutional: no fever  Eyes: no drainage  ENT: no runny nose   Cardiovascular:  no chest pain  Resp: no SOB  GI: no vomiting GU: no dysuria Integumentary: no rash  Allergy: no hives  Musculoskeletal: no leg swelling  Neurological: no slurred speech ROS otherwise negative  PAST MEDICAL HISTORY/PAST SURGICAL HISTORY:  Past Medical History  Diagnosis Date  . Pregnancy   . Smoker 08/10/2013  . 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 Medical history non-contributory     MEDICATIONS:  Prior to Admission medications   Medication Sig Start Date End Date Taking? Authorizing Provider  ibuprofen (ADVIL,MOTRIN) 800 MG tablet Take 1 tablet (800 mg total) by mouth every 8 (eight) hours as needed for mild pain. 10/06/15   Rennie Rouch N Jeremias Broyhill, DO  levonorgestrel (LILETTA, 52 MG,) 18.6 MCG/DAY IUD IUD 1 each by Intrauterine route once.    Historical Provider, MD  naproxen (NAPROSYN) 500 MG tablet Take 1 tablet (500 mg total) by mouth 2 (two) times daily with a meal. 05/18/14   Jacklyn Shell, CNM  oxyCODONE-acetaminophen (PERCOCET/ROXICET) 5-325 MG tablet Take 1-2 tablets by mouth every 6 (six) hours as needed. 10/06/15   Saysha Menta N Leland Staszewski, DO    ALLERGIES:  No Known Allergies  SOCIAL HISTORY:  Social History  Substance Use Topics  . Smoking status: Current Some Day Smoker -- 0.25 packs/day    Types: Cigarettes  . Smokeless tobacco: Never Used  . Alcohol Use: Yes     Comment: occ.     FAMILY  HISTORY: Family History  Problem Relation Age of Onset  . Hypertension Mother   . Stroke Father   . Heart Problems Father   . Hypertension Maternal Grandfather   . Kidney disease Maternal Grandfather     EXAM: BP 137/94 mmHg  Pulse 76  Temp(Src) 97.9 F (36.6 C) (Oral)  Resp 22  Ht  (1.6 m)  Wt 235 lb (106.595 kg)  BMI 41.64 kg/m2  SpO2 98%  LMP 09/29/2015 CONSTITUTIONAL: Alert and oriented and responds appropriately to questions. Well-appearing; well-nourished HEAD: Normocephalic EYES: Conjunctivae clear, PERRL ENT: normal nose; no rhinorrhea; moist mucous membranes NECK: Supple, no meningismus, no LAD  CARD: RRR; S1 and S2 appreciated; no murmurs, no clicks, no rubs, no gallops RESP: Normal chest excursion without splinting or tachypnea; breath sounds clear and equal bilaterally; no wheezes, no rhonchi, no rales, no hypoxia or respiratory distress, speaking full sentences ABD/GI: Normal bowel sounds; non-distended; soft, non-tender, no rebound, no guarding, no peritoneal signs BACK:  The back appears normal and is non-tender to palpation, there is no CVA tenderness EXT: Tender to palpation over the left lateral foot without bony deformity, ecchymosis or swelling. 2+ DP pulses bilaterally. No tenderness over the left ankle, left tibia or fibula, left knee, left hip. Normal sensation diffusely. Normal ROM in all joints; otherwise extremities are non-tender to palpation; no edema; normal capillary refill; no cyanosis, no calf tenderness or swelling  SKIN: Normal color for age and race; warm; no rash NEURO: Moves all extremities equally, sensation to light touch intact diffusely, cranial nerves II through XII intact PSYCH: The patient's mood and manner are appropriate. Grooming and personal hygiene are appropriate.  MEDICAL DECISION MAKING: Patient here with left foot sprain. X-ray shows no fracture or dislocation. Provided with Ace wrap for cough for, crutches to use as  needed. We'll discharge with ibuprofen, Percocet for pain control. Have recommended that she keep her leg elevated and apply ice as needed. We'll give her Dr. Mort Sawyers outpatient follow-up information of symptoms do not improve despite medical management one week. Discussed return precautions. She verbalizes understanding and is comfortable with this plan.   Nursing notes have been reviewed.       Layla Maw Rosaleah Person, DO 10/06/15 [phone]

## 2015-10-06 NOTE — Discharge Instructions (Signed)
Foot Sprain °A foot sprain is an injury to one of the strong bands of tissue (ligaments) that connect and support the many bones in your feet. The ligament can be stretched too much or it can tear. A tear can be either partial or complete. The severity of the sprain depends on how much of the ligament was damaged or torn. °CAUSES °A foot sprain is usually caused by suddenly twisting or pivoting your foot. °RISK FACTORS °This injury is more likely to occur in people who: °· Play a sport, such as basketball or football. °· Exercise or play a sport without warming up. °· Start a new workout or sport. °· Suddenly increase how long or hard they exercise or play a sport. °SYMPTOMS °Symptoms of this condition start soon after an injury and include: °· Pain, especially in the arch of the foot. °· Bruising. °· Swelling. °· Inability to walk or use the foot to support body weight. °DIAGNOSIS °This condition is diagnosed with a medical history and physical exam. You may also have imaging tests, such as: °· X-rays to make sure there are no broken bones (fractures). °· MRI to see if the ligament has torn. °TREATMENT °Treatment varies depending on the severity of your sprain. Mild sprains can be treated with rest, ice, compression, and elevation (RICE). If your ligament is overstretched or partially torn, treatment usually involves keeping your foot in a fixed position (immobilization) for a period of time. To help you do this, your health care provider will apply a bandage, splint, or walking boot to keep your foot from moving until it heals. You may also be advised to use crutches or a scooter for a few weeks to avoid bearing weight on your foot while it is healing. °If your ligament is fully torn, you may need surgery to reconnect the ligament to the bone. After surgery, a cast or splint will be applied and will need to stay on your foot while it heals. °Your health care provider may also suggest exercises or physical therapy  to strengthen your foot. °HOME CARE INSTRUCTIONS °If You Have a Bandage, Splint, or Walking Boot: °· Wear it as directed by your health care provider. Remove it only as directed by your health care provider. °· Loosen the bandage, splint, or walking boot if your toes become numb and tingle, or if they turn cold and blue. °Bathing °· If your health care provider approves bathing and showering, cover the bandage or splint with a watertight plastic bag to protect it from water. Do not let the bandage or splint get wet. °Managing Pain, Stiffness, and Swelling  °· If directed, apply ice to the injured area: °¨ Put ice in a plastic bag. °¨ Place a towel between your skin and the bag. °¨ Leave the ice on for 20 minutes, 2-3 times per day. °· Move your toes often to avoid stiffness and to lessen swelling. °· Raise (elevate) the injured area above the level of your heart while you are sitting or lying down. °Driving °· Do not drive or operate heavy machinery while taking pain medicine. °· Do not drive while wearing a bandage, splint, or walking boot on a foot that you use for driving. °Activity °· Rest as directed by your health care provider. °· Do not use the injured foot to support your body weight until your health care provider says that you can. Use crutches or other supportive devices as directed by your health care provider. °· Ask your health care   provider what activities are safe for you. Gradually increase how much and how far you walk until your health care provider says it is safe to return to full activity.  Do any exercise or physical therapy as directed by your health care provider. General Instructions  If a splint was applied, do not put pressure on any part of it until it is fully hardened. This may take several hours.  Take medicines only as directed by your health care provider. These include over-the-counter medicines and prescription medicines.  Keep all follow-up visits as directed by your  health care provider. This is important.  When you can walk without pain, wear supportive shoes that have stiff soles. Do not wear flip-flops, and do not walk barefoot. SEEK MEDICAL CARE IF:  Your pain is not controlled with medicine.  Your bruising or swelling gets worse or does not get better with treatment.  Your splint or walking boot is damaged. SEEK IMMEDIATE MEDICAL CARE IF:  Your foot is numb or blue.  Your foot feels colder than normal.   This information is not intended to replace advice given to you by your health care provider. Make sure you discuss any questions you have with your health care provider.   Document Released: 12/21/2001 Document Revised: 11/15/2014 Document Reviewed: 05/04/2014 Elsevier Interactive Patient Education 2016 Else[employer] Inc[employer]   RICE for Routine Care of Injuries Theroutine careofmanyinjuriesincludes rest, ice, compression, and elevation (RICE therapy). RICE therapy is often recommended for injuries to soft tissues, such as a muscle strain, ligament injuries, bruises, and overuse injuries. It can also be used for some bony injuries. Using RICE therapy can help to relieve pain, lessen swelling, and enable your body to heal. Rest Rest is required to allow your body to heal. This usually involves reducing your normal activities and avoiding use of the injured part of your body. Generally, you can return to your normal activities when you are comfortable and have been given permission by your health care provider. Ice Icing your injury helps to keep the swelling down, and it lessens pain. Do not apply ice directly to your skin.  Put ice in a plastic bag.  Place a towel between your skin and the bag.  Leave the ice on for 20 minutes, 2-3 times a day. Do this for as long as you are directed by your health care provider. Compression Compression means putting pressure on the injured area. Compression helps to keep swelling down, gives support, and  helps with discomfort. Compression may be done with an elastic bandage. If an elastic bandage has been applied, follow these general tips:  Remove and reapply the bandage every 3-4 hours or as directed by your health care provider.  Make sure the bandage is not wrapped too tightly, because this can cut off circulation. If part of your body beyond the bandage becomes blue, numb, cold, swollen, or more painful, your bandage is most likely too tight. If this occurs, remove your bandage and reapply it more loosely.  See your health care provider if the bandage seems to be making your problems worse rather than better. Elevation Elevation means keeping the injured area raised. This helps to lessen swelling and decrease pain. If possible, your injured area should be elevated at or above the level of your heart or the center of your chest. WHEN SHOULD I SEEK MEDICAL CARE? You should seek medical care if:  Your pain and swelling continue.  Your symptoms are getting worse rather than improving. These  symptoms may indicate that further evaluation or further X-rays are needed. Sometimes, X-rays may not show a small broken bone (fracture) until a number of days later. Make a follow-up appointment with your health care provider. WHEN SHOULD I SEEK IMMEDIATE MEDICAL CARE? You should seek immediate medical care if:  You have sudden severe pain at or below the area of your injury.  You have redness or increased swelling around your injury.  You have tingling or numbness at or below the area of your injury that does not improve after you remove the elastic bandage.   This information is not intended to replace advice given to you by your health care provider. Make sure you discuss any questions you have with your health care provider.   Document Released: 10/13/2000 Document Revised: 03/22/2015 Document Reviewed: 06/08/2014 Elsevier Interactive Patient Education [employer]

## 2015-10-06 NOTE — ED Notes (Signed)
Pt c/o left foot pain. Pt states she missed a step earlier.

## 2015-10-10 ENCOUNTER — Ambulatory Visit (INDEPENDENT_AMBULATORY_CARE_PROVIDER_SITE_OTHER): Payer: Medicaid Other

## 2015-10-10 ENCOUNTER — Ambulatory Visit (INDEPENDENT_AMBULATORY_CARE_PROVIDER_SITE_OTHER): Payer: Medicaid Other | Admitting: Orthopaedic Surgery

## 2015-10-10 ENCOUNTER — Encounter: Payer: Self-pay | Admitting: Orthopaedic Surgery

## 2015-10-10 VITALS — BP 153/106 | HR 72 | Temp 97.7°F | Ht 63.0 in | Wt 237.0 lb

## 2015-10-10 DIAGNOSIS — M25572 Pain in left ankle and joints of left foot: Secondary | ICD-10-CM

## 2015-10-10 DIAGNOSIS — M79672 Pain in left foot: Secondary | ICD-10-CM

## 2015-10-10 MED ORDER — ACETAMINOPHEN-CODEINE #3 300-30 MG PO TABS: ORAL_TABLET | ORAL | Status: DC

## 2015-10-10 NOTE — Progress Notes (Signed)
Subjective: I hurt my left foot and ankle five days ago    Patient ID: Sherri Woodward, female    DOB: 26-Mar-1986, 30 y.o.   MRN: [id number]  Ankle Injury  The incident occurred 3 to 5 days ago. The incident occurred at home. The injury mechanism was an inversion injury and a twisting injury. The pain is present in the left ankle and left foot. The quality of the pain is described as aching. The pain is at a severity of 4/10. The pain is moderate. The pain has been fluctuating since onset. Associated symptoms include an inability to bear weight and a loss of motion. Pertinent negatives include no loss of sensation, muscle weakness, numbness or tingling. The symptoms are aggravated by weight bearing and movement. She has tried acetaminophen, elevation, ice, immobilization, non-weight bearing, NSAIDs and rest for the symptoms. The treatment provided moderate relief.   She hurt her left ankle and foot at home five days ago in a twisting injury.  She has pain laterally.  She has no redness, no ecchymosis.  She is on crutches.  She has no pain medicine.  She has no other injury.  Review of Systems  HENT: Negative for congestion.   Respiratory: Negative for cough and shortness of breath.   Cardiovascular: Negative for chest pain and leg swelling.  Endocrine: Positive for cold intolerance.  Musculoskeletal: Positive for joint swelling, arthralgias and gait problem.  Allergic/Immunologic: Negative for environmental allergies.  Neurological: Negative for tingling and numbness.   Past Medical History  Diagnosis Date  . Pregnancy   . Smoker 08/10/2013  . 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 Medical history non-contributory    Social History   Social History  . Marital Status: Single    Spouse Name: N/A  . Number of Children: N/A  . Years of Education: N/A   Occupational History  . Not on file.   Social History Main Topics  . Smoking status:  Current Some Day Smoker -- 0.25 packs/day    Types: Cigarettes  . Smokeless tobacco: Never Used  . Alcohol Use: Yes     Comment: occ.   . Drug Use: No  . Sexual Activity: Not Currently    Birth Control/ Protection: None   Other Topics Concern  . Not on file   Social History Narrative   Past Surgical History  Procedure Laterality Date  . Cesarean section    . Tongue surgery      due to accident as child  . Cesarean section N/A 02/25/2014    Procedure: REPEAT CESAREAN SECTION;  Surgeon: [name], MD;  Location: [facility];  Service: Obstetrics;  Laterality: N/A;   BP 153/106 mmHg  Pulse 72  Temp(Src) 97.7 F (36.5 C) (Tympanic)  Ht  (1.6 m)  Wt 237 lb (107.502 kg)  BMI 41.99 kg/m2  LMP 09/29/2015  Breastfeeding? No     Objective:   Physical Exam  Constitutional: She is oriented to person, place, and time. She appears well-developed and well-nourished.  HENT:  Head: Normocephalic and atraumatic.  Eyes: Conjunctivae and EOM are normal. Pupils are equal, round, and reactive to light.  Neck: Neck supple.  Cardiovascular: Normal rate, regular rhythm and intact distal pulses.   Pulmonary/Chest: Effort normal.  Abdominal: Soft.  Musculoskeletal: She exhibits tenderness (Pain and swelling of the left lateral ankle.  No ecchymosis or redness.  Some pain lateral left foot.  No deformity.  On crutches.).       Feet:  Neurological: She is alert and oriented to person, place, and time. She has normal reflexes. She displays normal reflexes. No cranial nerve deficit. She exhibits normal muscle tone. Coordination normal.  Skin: Skin is warm and dry.  Psychiatric: She has a normal mood and affect. Her behavior is normal. Judgment and thought content normal.  Nursing note and vitals reviewed.  X-rays done, reported separately.  Encounter Diagnoses  Name Primary?  . Left ankle pain Yes  . Left foot pain       Assessment & Plan:  CAM walker given with  instructions.  Contrast Bath sheet of instructions given.  Rx for pain given.  Return in one week.  Call if any problem  Continue crutches.

## 2015-10-10 NOTE — Patient Instructions (Signed)
Do contrast baths.  CAM walker given.

## 2015-10-17 ENCOUNTER — Ambulatory Visit: Payer: Medicaid Other | Admitting: Orthopaedic Surgery

## 2015-10-17 ENCOUNTER — Encounter: Payer: Self-pay | Admitting: Orthopaedic Surgery

## 2015-11-28 ENCOUNTER — Ambulatory Visit: Payer: Medicaid Other | Admitting: Obstetrics and Gynecology

## 2016-03-06 ENCOUNTER — Ambulatory Visit: Payer: Medicaid Other | Admitting: Orthopaedic Surgery

## 2016-03-06 ENCOUNTER — Encounter: Payer: Self-pay | Admitting: Orthopaedic Surgery

## 2017-04-03 ENCOUNTER — Encounter: Payer: Self-pay | Admitting: Orthopaedic Surgery

## 2017-04-03 ENCOUNTER — Ambulatory Visit (INDEPENDENT_AMBULATORY_CARE_PROVIDER_SITE_OTHER): Payer: Medicaid Other | Admitting: Orthopaedic Surgery

## 2017-04-03 ENCOUNTER — Ambulatory Visit (INDEPENDENT_AMBULATORY_CARE_PROVIDER_SITE_OTHER): Payer: Medicaid Other

## 2017-04-03 VITALS — BP 145/106 | HR 82 | Temp 98.0°F | Ht 63.0 in | Wt 251.0 lb

## 2017-04-03 DIAGNOSIS — M25572 Pain in left ankle and joints of left foot: Secondary | ICD-10-CM

## 2017-04-03 DIAGNOSIS — F17210 Nicotine dependence, cigarettes, uncomplicated: Secondary | ICD-10-CM | POA: Diagnosis not present

## 2017-04-03 MED ORDER — NAPROXEN 500 MG PO TABS: 500.0000 mg | ORAL_TABLET | Freq: Two times a day (BID) | ORAL | 5 refills | Status: DC

## 2017-04-03 NOTE — Progress Notes (Signed)
Patient Sherri Woodward, female DOB:Oct 07, 1985, 31 y.o. VPX:[id number]  Chief Complaint  Patient presents with  . Follow-up    Left foot and ankle    HPI  Sherri Woodward is a 31 y.o. female who complains of left ankle pain and more lateral foot pain at the base of the fifth metatarsal.  She has no new trauma.  I saw her for this a year and a half ago.  She got better but now has more pain.  She has no redness, no swelling.  She has no change in shoe wear.  She smokes and is willing to cut back.  HPI  Body mass index is 44.46 kg/m.  ROS  Review of Systems  HENT: Negative for congestion.   Respiratory: Negative for cough and shortness of breath.   Cardiovascular: Negative for chest pain and leg swelling.  Endocrine: Positive for cold intolerance.  Musculoskeletal: Positive for arthralgias, gait problem and joint swelling.  Allergic/Immunologic: Negative for environmental allergies.  Neurological: Negative for numbness.    Past Medical History:  Diagnosis Date  . 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 Medical history non-contributory   . Pregnancy   . Smoker 08/10/2013    Past Surgical History:  Procedure Laterality Date  . CESAREAN SECTION    . CESAREAN SECTION N/A 02/25/2014   Procedure: REPEAT CESAREAN SECTION;  Surgeon: [name], MD;  Location: [facility];  Service: Obstetrics;  Laterality: N/A;  . TONGUE SURGERY     due to accident as child    Family History  Problem Relation Age of Onset  . Hypertension Mother   . Stroke Father   . Heart Problems Father   . Hypertension Maternal Grandfather   . Kidney disease Maternal Grandfather     Social History Social History  Substance Use Topics  . Smoking status: Current Some Day Smoker    Packs/day: 0.25    Types: Cigarettes  . Smokeless tobacco: Never Used  . Alcohol use Yes     Comment: occ.     No Known Allergies  Current Outpatient Prescriptions   Medication Sig Dispense Refill  . acetaminophen-codeine (TYLENOL #3) 300-30 MG tablet One tablet every four hours as needed for pain.  Must last TEN days. 40 tablet 2  . levonorgestrel (LILETTA, 52 MG,) 18.6 MCG/DAY IUD IUD 1 each by Intrauterine route once.    . naproxen (NAPROSYN) 500 MG tablet Take 1 tablet (500 mg total) by mouth 2 (two) times daily with a meal. 60 tablet 5  . oxyCODONE-acetaminophen (PERCOCET/ROXICET) 5-325 MG tablet Take 1-2 tablets by mouth every 6 (six) hours as needed. 15 tablet 0   No current facility-administered medications for this visit.      Physical Exam  Blood pressure (!) 145/106, pulse 82, temperature 98 F (36.7 C), height 5\' 3"  (1.6 m), weight 251 lb (113.9 kg).  Constitutional: overall normal hygiene, normal nutrition, well developed, normal grooming, normal body habitus. Assistive device:none  Musculoskeletal: gait and station Limp left, muscle tone and strength are normal, no tremors or atrophy is present.  .  Neurological: coordination overall normal.  Deep tendon reflex/nerve stretch intact.  Sensation normal.  Cranial nerves II-XII intact.   Skin:   Normal overall no scars, lesions, ulcers or rashes. No psoriasis.  Psychiatric: Alert and oriented x 3.  Recent memory intact, remote memory unclear.  Normal mood and affect. Well groomed.  Good eye contact.  Cardiovascular: overall no swelling,  no varicosities, no edema bilaterally, normal temperatures of the legs and arms, no clubbing, cyanosis and good capillary refill.  Lymphatic: palpation is normal.  All other systems reviewed and are negative   Left foot has some slight tenderness of the base of the left fifth metatarsal area.  She has no swelling, no redness, no pain over the peroneal tendons.  ROM is full.  NV is intact.  X-rays were done of her left foot, reported separately.  The patient has been educated about the nature of the problem(s) and counseled on treatment options.  The  patient appeared to understand what I have discussed and is in agreement with it.  Encounter Diagnoses  Name Primary?  . Pain of joint of left ankle and foot Yes  . Cigarette nicotine dependence without complication     PLAN Call if any problems.  Precautions discussed.  Continue current medications.   Return to clinic 1 month   Begin Naprosyn.  Electronically Signed Darreld Mclean, MD 9/20/20183:59 PM

## 2017-04-03 NOTE — Patient Instructions (Addendum)
Steps to Quit Smoking Smoking tobacco can be bad for your health. It can also affect almost every organ in your body. Smoking puts you and people around you at risk for many serious Bailynn Dyk-lasting (chronic) diseases. Quitting smoking is hard, but it is one of the best things that you can do for your health. It is never too late to quit. What are the benefits of quitting smoking? When you quit smoking, you lower your risk for getting serious diseases and conditions. They can include:  Lung cancer or lung disease.  Heart disease.  Stroke.  Heart attack.  Not being able to have children (infertility).  Weak bones (osteoporosis) and broken bones (fractures).  If you have coughing, wheezing, and shortness of breath, those symptoms may get better when you quit. You may also get sick less often. If you are pregnant, quitting smoking can help to lower your chances of having a baby of low birth weight. What can I do to help me quit smoking? Talk with your doctor about what can help you quit smoking. Some things you can do (strategies) include:  Quitting smoking totally, instead of slowly cutting back how much you smoke over a period of time.  Going to in-person counseling. You are more likely to quit if you go to many counseling sessions.  Using resources and support systems, such as: ? Online chats with a counselor. ? Phone quitlines. ? Printed self-help materials. ? Support groups or group counseling. ? Text messaging programs. ? Mobile phone apps or applications.  Taking medicines. Some of these medicines may have nicotine in them. If you are pregnant or breastfeeding, do not take any medicines to quit smoking unless your doctor says it is okay. Talk with your doctor about counseling or other things that can help you.  Talk with your doctor about using more than one strategy at the same time, such as taking medicines while you are also going to in-person counseling. This can help make  quitting easier. What things can I do to make it easier to quit? Quitting smoking might feel very hard at first, but there is a lot that you can do to make it easier. Take these steps:  Talk to your family and friends. Ask them to support and encourage you.  Call phone quitlines, reach out to support groups, or work with a counselor.  Ask people who smoke to not smoke around you.  Avoid places that make you want (trigger) to smoke, such as: ? Bars. ? Parties. ? Smoke-break areas at work.  Spend time with people who do not smoke.  Lower the stress in your life. Stress can make you want to smoke. Try these things to help your stress: ? Getting regular exercise. ? Deep-breathing exercises. ? Yoga. ? Meditating. ? Doing a body scan. To do this, close your eyes, focus on one area of your body at a time from head to toe, and notice which parts of your body are tense. Try to relax the muscles in those areas.  Download or buy apps on your mobile phone or tablet that can help you stick to your quit plan. There are many free apps, such as QuitGuide from the CDC (Centers for Disease Control and Prevention). You can find more support from smokefree.gov and other websites.  This information is not intended to replace advice given to you by your health care provider. Make sure you discuss any questions you have with your health care provider. Document Released: 04/27/2009 Document   Revised: 02/27/2016 Document Reviewed: 11/15/2014 Elsevier Interactive Patient Education  2018 Elsevier Inc.  

## 2017-05-01 ENCOUNTER — Ambulatory Visit: Payer: Medicaid Other | Admitting: Orthopaedic Surgery

## 2017-05-14 ENCOUNTER — Ambulatory Visit: Payer: Medicaid Other | Admitting: Family Medicine

## 2017-06-02 ENCOUNTER — Other Ambulatory Visit: Payer: Self-pay

## 2017-06-02 ENCOUNTER — Encounter (HOSPITAL_COMMUNITY): Payer: Self-pay | Admitting: Emergency Medicine

## 2017-06-02 ENCOUNTER — Inpatient Hospital Stay (HOSPITAL_COMMUNITY)
Admission: EM | Admit: 2017-06-02 | Discharge: 2017-06-04 | DRG: 059 | Disposition: A | Discharge status: Home / Self Care | Payer: Medicaid Other | Attending: Internal Medicine | Admitting: Internal Medicine

## 2017-06-02 DIAGNOSIS — G35 Multiple sclerosis: Principal | ICD-10-CM | POA: Diagnosis present

## 2017-06-02 DIAGNOSIS — Z720 Tobacco use: Secondary | ICD-10-CM | POA: Diagnosis present

## 2017-06-02 DIAGNOSIS — Z6841 Body Mass Index (BMI) 40.0 and over, adult: Secondary | ICD-10-CM

## 2017-06-02 DIAGNOSIS — F17210 Nicotine dependence, cigarettes, uncomplicated: Secondary | ICD-10-CM | POA: Diagnosis present

## 2017-06-02 DIAGNOSIS — R202 Paresthesia of skin: Secondary | ICD-10-CM

## 2017-06-02 DIAGNOSIS — R200 Anesthesia of skin: Secondary | ICD-10-CM | POA: Diagnosis present

## 2017-06-02 DIAGNOSIS — E669 Obesity, unspecified: Secondary | ICD-10-CM | POA: Diagnosis present

## 2017-06-02 LAB — COMPREHENSIVE METABOLIC PANEL
ALT: 17 U/L (ref 14–54)
ANION GAP: 4 — AB (ref 5–15)
AST: 20 U/L (ref 15–41)
Albumin: 3.7 g/dL (ref 3.5–5.0)
Alkaline Phosphatase: 42 U/L (ref 38–126)
BUN: 13 mg/dL (ref 6–20)
CHLORIDE: 108 mmol/L (ref 101–111)
CO2: 26 mmol/L (ref 22–32)
Calcium: 8.7 mg/dL — ABNORMAL LOW (ref 8.9–10.3)
Creatinine, Ser: 0.77 mg/dL (ref 0.44–1.00)
GFR calc Af Amer: >60 mL/min (ref >60)
GFR calc non Af Amer: >60 mL/min (ref >60)
GLUCOSE: 81 mg/dL (ref 65–99)
Potassium: 3.7 mmol/L (ref 3.5–5.1)
SODIUM: 138 mmol/L (ref 135–145)
Total Bilirubin: 0.4 mg/dL (ref 0.3–1.2)
Total Protein: 7.1 g/dL (ref 6.5–8.1)

## 2017-06-02 LAB — CBC WITH DIFFERENTIAL/PLATELET
Basophils Absolute: 0 10*3/uL (ref 0.0–0.1)
Basophils Relative: 0 %
EOS ABS: 0.2 10*3/uL (ref 0.0–0.7)
Eosinophils Relative: 3 %
HCT: 40.1 % (ref 36.0–46.0)
Hemoglobin: 13 g/dL (ref 12.0–15.0)
Lymphocytes Relative: 40 %
Lymphs Abs: 2.3 10*3/uL (ref 0.7–4.0)
MCH: 32.1 pg (ref 26.0–34.0)
MCHC: 32.4 g/dL (ref 30.0–36.0)
MCV: 99 fL (ref 78.0–100.0)
MONO ABS: 0.4 10*3/uL (ref 0.1–1.0)
MONOS PCT: 7 %
NEUTROS ABS: 2.9 10*3/uL (ref 1.7–7.7)
NEUTROS PCT: 50 %
Platelets: 252 10*3/uL (ref 150–400)
RBC: 4.05 MIL/uL (ref 3.87–5.11)
RDW: 13.6 % (ref 11.5–15.5)
WBC: 5.9 10*3/uL (ref 4.0–10.5)

## 2017-06-02 NOTE — ED Provider Notes (Signed)
[facility] EMERGENCY DEPARTMENT Provider Note   CSN: [id number] Arrival date & time: 06/02/17  2046     History   Chief Complaint Chief Complaint  Patient presents with  . Numbness    HPI Sherri Woodward is a 31 y.o. female.  Patient reports constant "numbness" from mid rib cage down bilaterally.  States this is been constant since Saturday morning which is almost 3 days ago.  Denies any falls or injuries.  Denies any pain.  Reports the numbness has persisted and not changed or moved at all.  Mid rib cage underneath her breasts all the way down her body.  Denies any focal weakness.  Denies any bowel or bladder incontinence.  Denies any fever or vomiting.  Denies any chest pain or shortness of breath.  States she waited until now because she was hoping the symptoms would get better.  She does take birth control.  She does have a family history of multiple sclerosis in her father.  She does not have any medical problems herself that she knows of.  Denies any trouble talking or trouble swallowing or problems with her vision.  No headache. No recent immunizations.   The history is provided by the patient.    Past Medical History:  Diagnosis Date  . 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 Medical history non-contributory   . Pregnancy   . Smoker 08/10/2013    Patient Active Problem List   Diagnosis Date Noted  . Encounter for IUD insertion 04/20/2014  . Postpartum care following cesarean delivery 03/31/2014  . Post-operative state 03/04/2014  . Previous cesarean delivery, delivered 02/25/2014  . 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 Marijuana use 08/11/2013  . Previous cesarean section 08/10/2013  . Smoker 08/10/2013    Past Surgical History:  Procedure Laterality Date  . CESAREAN SECTION    . REPEAT CESAREAN SECTION N/A 02/25/2014   Performed by [name], MD at [facility]  . TONGUE SURGERY     due to accident as child    OB  History    Gravida Para Term Preterm AB Living   2 2 2     2    SAB TAB Ectopic Multiple Live Births           2       Home Medications    Prior to Admission medications   Medication Sig Start Date End Date Taking? Authorizing Provider  acetaminophen-codeine (TYLENOL #3) 300-30 MG tablet One tablet every four hours as needed for pain.  Must last TEN days. 10/10/15   Darreld Mclean, MD  levonorgestrel (LILETTA, 52 MG,) 18.6 MCG/DAY IUD IUD 1 each by Intrauterine route once.    [provider]  naproxen (NAPROSYN) 500 MG tablet Take 1 tablet (500 mg total) by mouth 2 (two) times daily with a meal. 04/03/17   Darreld Mclean, MD  oxyCODONE-acetaminophen (PERCOCET/ROXICET) 5-325 MG tablet Take 1-2 tablets by mouth every 6 (six) hours as needed. 10/06/15   Ward, Layla Maw, DO    Family History Family History  Problem Relation Age of Onset  . Hypertension Mother   . Stroke Father   . Heart Problems Father   . Hypertension Maternal Grandfather   . Kidney disease Maternal Grandfather     Social History Social History   Tobacco Use  . Smoking status: Current Some Day Smoker    Packs/day: 0.25    Types: Cigarettes  . Smokeless tobacco: Never Used  Substance Use Topics  . Alcohol use: Yes    Comment: occ.   . Drug use: Yes    Frequency: 7.0 times per week    Types: Marijuana     Allergies   Patient has no known allergies.   Review of Systems Review of Systems  Constitutional: Negative for activity change, appetite change and fever.  HENT: Negative for congestion and rhinorrhea.   Eyes: Negative for photophobia.  Respiratory: Negative for cough, chest tightness and shortness of breath.   Cardiovascular: Negative for chest pain.  Gastrointestinal: Negative for abdominal pain, nausea and vomiting.  Genitourinary: Negative for dysuria, hematuria and vaginal discharge.  Musculoskeletal: Negative for arthralgias and myalgias.  Neurological: Positive for numbness.  Negative for dizziness, seizures, facial asymmetry, speech difficulty, weakness, light-headedness and headaches.   all other systems are negative except as noted in the HPI and PMH.     Physical Exam Updated Vital Signs BP (!) 152/104   Pulse 73   Temp 98.4 F (36.9 C) (Oral)   Resp 18   Ht 5\' 4"  (1.626 m)   Wt 115.2 kg (254 lb)   SpO2 99%   BMI 43.60 kg/m   Physical Exam  Constitutional: She is oriented to person, place, and time. She appears well-developed and well-nourished. No distress.  HENT:  Head: Normocephalic and atraumatic.  Mouth/Throat: Oropharynx is clear and moist. No oropharyngeal exudate.  Eyes: Conjunctivae and EOM are normal. Pupils are equal, round, and reactive to light.  Neck: Normal range of motion. Neck supple.  No meningismus.  Cardiovascular: Normal rate, regular rhythm, normal heart sounds and intact distal pulses.  No murmur heard. Pulmonary/Chest: Effort normal and breath sounds normal. No respiratory distress. She exhibits no tenderness.  Abdominal: Soft. There is no tenderness. There is no rebound and no guarding.  Genitourinary:  Genitourinary Comments: Declines rectal exam  Musculoskeletal: Normal range of motion. She exhibits no edema or tenderness.  Neurological: She is alert and oriented to person, place, and time. No cranial nerve deficit. She exhibits normal muscle tone. Coordination normal.   5/5 strength throughout. CN 2-12 intact.Equal grip strength.  Patient reports subjective numbness and tingling to bilateral legs and abnormal sensation from the mid thoracic spine down. +1 patellar reflexes bilaterally Normal strength and normal sensation to upper extremities. She declines rectal exam  Skin: Skin is warm.  Psychiatric: She has a normal mood and affect. Her behavior is normal.  Nursing note and vitals reviewed.    ED Treatments / Results  Labs (all labs ordered are listed, but only abnormal results are displayed) Labs Reviewed    COMPREHENSIVE METABOLIC PANEL - Abnormal; Notable for the following components:      Result Value   Calcium 8.7 (*)    Anion gap 4 (*)    All other components within normal limits  CBC WITH DIFFERENTIAL/PLATELET  URINALYSIS, ROUTINE W REFLEX MICROSCOPIC  PREGNANCY, URINE    EKG  EKG Interpretation None       Radiology No results found.  Procedures Procedures (including critical care time)  Medications Ordered in ED Medications - No data to display   Initial Impression / Assessment and Plan / ED Course  I have reviewed the triage vital signs and the nursing notes.  Pertinent labs & imaging results that were available during my care of the patient were reviewed by me and considered in my medical decision making (see chart for details).     Patient presents with a 3-day history of abnormal  sensation from mid thoracic spine down.  No focal weakness.  No incontinence.  She has normal strength.  +1 patellar reflexes bilaterally  Family history of multiple sclerosis. MRI not available at this time. Labs reassuring.   Case discussed with Dr. Laurence SlateAroor of neurology.  He agrees that multiple sclerosis is on the differential although patient does not need emergent transfer for MRI.  He feels if suspicion is high, IV Solu-Medrol 1 g every 8 hours can be started an MRI performed in the morning.  transverse myelitis and Guillain-Barr syndrome are also on the differential.  Patient agreeable to admission and steroids overnight.  MRI in the morning and possible neurology consultation.  Discussed with Dr. Robb Matarrtiz. Final Clinical Impressions(s) / ED Diagnoses   Final diagnoses:  Paresthesias    ED Discharge Orders    None       Enijah Furr, Jeannett SeniorStephen, MD 06/03/17 [phone]

## 2017-06-02 NOTE — ED Triage Notes (Signed)
Pt states she has had intermittent numbness from her waist down since this past Saturday.  Denies any pain or any other s/s.  Pt states she is not numb at this time.

## 2017-06-03 ENCOUNTER — Encounter (HOSPITAL_COMMUNITY): Payer: Self-pay | Admitting: Internal Medicine

## 2017-06-03 ENCOUNTER — Observation Stay (HOSPITAL_COMMUNITY): Payer: Medicaid Other

## 2017-06-03 ENCOUNTER — Other Ambulatory Visit: Payer: Self-pay

## 2017-06-03 DIAGNOSIS — F17210 Nicotine dependence, cigarettes, uncomplicated: Secondary | ICD-10-CM | POA: Diagnosis present

## 2017-06-03 DIAGNOSIS — R200 Anesthesia of skin: Secondary | ICD-10-CM | POA: Diagnosis present

## 2017-06-03 DIAGNOSIS — R202 Paresthesia of skin: Secondary | ICD-10-CM

## 2017-06-03 DIAGNOSIS — Z6841 Body Mass Index (BMI) 40.0 and over, adult: Secondary | ICD-10-CM | POA: Diagnosis not present

## 2017-06-03 DIAGNOSIS — Z720 Tobacco use: Secondary | ICD-10-CM | POA: Diagnosis present

## 2017-06-03 DIAGNOSIS — G35 Multiple sclerosis: Secondary | ICD-10-CM | POA: Diagnosis present

## 2017-06-03 DIAGNOSIS — E669 Obesity, unspecified: Secondary | ICD-10-CM | POA: Diagnosis present

## 2017-06-03 LAB — URINALYSIS, ROUTINE W REFLEX MICROSCOPIC
Bilirubin Urine: NEGATIVE
GLUCOSE, UA: NEGATIVE mg/dL
Hgb urine dipstick: NEGATIVE
Ketones, ur: 5 mg/dL — AB
Nitrite: POSITIVE — AB
Protein, ur: NEGATIVE mg/dL
Specific Gravity, Urine: 1.027 (ref 1.005–1.030)
pH: 5 (ref 5.0–8.0)

## 2017-06-03 LAB — SEDIMENTATION RATE: SED RATE: 20 mm/h (ref 0–22)

## 2017-06-03 LAB — TSH: TSH: 0.561 u[IU]/mL (ref 0.350–4.500)

## 2017-06-03 LAB — PREGNANCY, URINE: Preg Test, Ur: NEGATIVE

## 2017-06-03 MED ORDER — ONDANSETRON HCL 4 MG PO TABS: 4.0000 mg | ORAL_TABLET | Freq: Four times a day (QID) | ORAL | Status: DC | PRN

## 2017-06-03 MED ORDER — NICOTINE 21 MG/24HR TD PT24
21.0000 mg | MEDICATED_PATCH | Freq: Every day | TRANSDERMAL | Status: DC
  Administered 2017-06-03 (×3): 21 mg via TRANSDERMAL
  Filled 2017-06-03 (×4): qty 1

## 2017-06-03 MED ORDER — SODIUM CHLORIDE 0.9 % IV SOLN
1000.0000 mg | Freq: Three times a day (TID) | INTRAVENOUS | Status: DC
  Administered 2017-06-03 (×3): 1000 mg via INTRAVENOUS
  Filled 2017-06-03 (×5): qty 8

## 2017-06-03 MED ORDER — METHYLPREDNISOLONE SODIUM SUCC 1000 MG IJ SOLR
1000.0000 mg | Freq: Every day | INTRAMUSCULAR | Status: DC
  Administered 2017-06-04: 1000 mg via INTRAVENOUS
  Filled 2017-06-03 (×3): qty 8

## 2017-06-03 MED ORDER — SODIUM CHLORIDE 0.45 % IV SOLN
INTRAVENOUS | Status: DC
  Administered 2017-06-03 – 2017-06-04 (×3): via INTRAVENOUS

## 2017-06-03 MED ORDER — METHYLPREDNISOLONE SODIUM SUCC 1000 MG IJ SOLR
INTRAMUSCULAR | Status: AC
  Filled 2017-06-03: qty 8

## 2017-06-03 MED ORDER — SODIUM CHLORIDE 0.9 % IV SOLN: 1000.0000 mg | Freq: Three times a day (TID) | INTRAVENOUS | Status: DC

## 2017-06-03 MED ORDER — ONDANSETRON HCL 4 MG/2ML IJ SOLN: 4.0000 mg | Freq: Four times a day (QID) | INTRAMUSCULAR | Status: DC | PRN

## 2017-06-03 MED ORDER — GADOBENATE DIMEGLUMINE 529 MG/ML IV SOLN
20.0000 mL | Freq: Once | INTRAVENOUS | Status: AC | PRN
  Administered 2017-06-03: 20 mL via INTRAVENOUS

## 2017-06-03 MED ORDER — OXYCODONE-ACETAMINOPHEN 5-325 MG PO TABS
1.0000 | ORAL_TABLET | Freq: Four times a day (QID) | ORAL | Status: DC | PRN
  Administered 2017-06-03: 1 via ORAL
  Filled 2017-06-03: qty 1

## 2017-06-03 MED ORDER — ENOXAPARIN SODIUM 40 MG/0.4ML ~~LOC~~ SOLN
40.0000 mg | SUBCUTANEOUS | Status: DC
  Filled 2017-06-03: qty 0.4

## 2017-06-03 NOTE — Progress Notes (Signed)
Patient briefly seen and examined, database reviewed.  Admitted with numbness and tingling of bilateral lower extremities.  MRI of C T and L-spine with concern for multiple sclerosis.  MRI of the brain has been requested.  Neurology had recommended high-dose steroids which have been ordered, formal neurology consultation is pending.  We will continue to follow neurology recommendations.  Peggye Pitt, MD [facility] Pager: [phone]

## 2017-06-03 NOTE — Consult Note (Signed)
[place] A. Merlene Laughter, MD     [email or web address]          Sherri Woodward is an 31 y.o. female.   ASSESSMENT/PLAN: 1. The clinical picture is most consistent with multiple sclerosis: The patient has gotten 3 doses of high-dose Solu-Medrol which is somewhat atypical as a usual doses thousand milligrams daily. I will advise that she get another dose at 12 noon tomorrow and then she can be discharged. Follow-up should be in the office without since 4-6 weeks. At that time, we will discuss long-term disease modifying treatment. I recommend that she be out of work until next Monday. Additional labs were also done.      The patient is a pleasant 31 year old black female who presents with numbness from the midthoracic region down for the last week or so. She reports constant numbness tingling and discomfort involving the legs and the trunk region. This is the first time she's had these symptoms. She has had episodic numbness and tingling of the hands lasting for 2 days but she did not seek medical attention for this. Her father has multiple sclerosis and apparently has significant neurological deficit from this. The patient is from the [facility] area. She reports that she's had some difficulties ambulating with her symptoms. She reports that she walks quite a bit at work and has had some fatigue of the legs over last several days with ambulation although she does not have weakness when at rest. She denies bladder or bowel symptoms. She denies visual symptoms.    GENERAL: This a pleasant obese female in no acute distress.  HEENT:  This is normal.  ABDOMEN: soft  EXTREMITIES: No edema   BACK: This is normal.  SKIN: Normal by inspection.    MENTAL STATUS: Alert and oriented. Speech, language and cognition are generally intact. Judgment and insight normal.   CRANIAL NERVES: Pupils are equal, round and reactive to light and accomodation; extra ocular movements are  full, there is no significant nystagmus; visual fields are full; upper and lower facial muscles are normal in strength and symmetric, there is no flattening of the nasolabial folds; tongue is midline; uvula is midline; shoulder elevation is normal.  MOTOR: Normal tone, bulk and strength; no pronator drift.  COORDINATION: Left finger to nose is normal, right finger to nose is normal, No rest tremor; no intention tremor; no postural tremor; no bradykinesia.  REFLEXES: Deep tendon reflexes are symmetrical and normal. Babinski reflexes are flexor bilaterally.   SENSATION: Markedly reduced sensation to temperature and light touch to about T8.       Blood pressure 128/72, pulse 80, temperature 98.6 F (37 C), temperature source Oral, resp. rate 18, height '5\' 4"'  (1.626 m), weight 254 lb (115.2 kg), SpO2 98 %.  Past Medical History:  Diagnosis Date  . 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 Medical history non-contributory   . Pregnancy   . Smoker 08/10/2013    Past Surgical History:  Procedure Laterality Date  . CESAREAN SECTION    . CESAREAN SECTION N/A 02/25/2014   Procedure: REPEAT CESAREAN SECTION;  Surgeon: Jonnie Kind, MD;  Location: [facility];  Service: Obstetrics;  Laterality: N/A;  . TONGUE SURGERY     due to accident as child    Family History  Problem Relation Age of Onset  . Hypertension Mother   . Stroke Father   . Heart Problems Father   . Multiple sclerosis Father   .  Seizures Father   . Hypertension Maternal Grandfather   . Kidney disease Maternal Grandfather     Social History:  reports that she has been smoking cigarettes.  She has been smoking about 0.25 packs per day. she has never used smokeless tobacco. She reports that she drinks alcohol. She reports that she uses drugs. Drug: Marijuana. Frequency: 7.00 times per week.  Allergies: No Known Allergies  Medications: Prior to Admission medications   Medication Sig  Start Date End Date Taking? Authorizing Provider  acetaminophen-codeine (TYLENOL #3) 300-30 MG tablet One tablet every four hours as needed for pain.  Must last TEN days. 10/10/15  Yes Sanjuana Kava, MD  levonorgestrel (LILETTA, 52 MG,) 18.6 MCG/DAY IUD IUD 1 each by Intrauterine route once.   Yes [provider]  naproxen (NAPROSYN) 500 MG tablet Take 1 tablet (500 mg total) by mouth 2 (two) times daily with a meal. Patient not taking: Reported on 06/03/2017 04/03/17   Sanjuana Kava, MD  oxyCODONE-acetaminophen (PERCOCET/ROXICET) 5-325 MG tablet Take 1-2 tablets by mouth every 6 (six) hours as needed. Patient not taking: Reported on 06/03/2017 10/06/15   Ward, Delice Bison, DO    Scheduled Meds: . enoxaparin (LOVENOX) injection  40 mg Subcutaneous Q24H  . nicotine  21 mg Transdermal Daily   Continuous Infusions: . sodium chloride 125 mL/hr at 06/03/17 1654  . methylPREDNISolone (SOLU-MEDROL) injection 1,000 mg (06/03/17 1654)   PRN Meds:.ondansetron **OR** ondansetron (ZOFRAN) IV     Results for orders placed or performed during the hospital encounter of 06/02/17 (from the past 48 hour(s))  CBC with Differential/Platelet     Status: None   Collection Time: 06/02/17 11:15 PM  Result Value Ref Range   WBC 5.9 4.0 - 10.5 K/uL   RBC 4.05 3.87 - 5.11 MIL/uL   Hemoglobin 13.0 12.0 - 15.0 g/dL   HCT 40.1 36.0 - 46.0 %   MCV 99.0 78.0 - 100.0 fL   MCH 32.1 26.0 - 34.0 pg   MCHC 32.4 30.0 - 36.0 g/dL   RDW 13.6 11.5 - 15.5 %   Platelets 252 150 - 400 K/uL   Neutrophils Relative % 50 %   Neutro Abs 2.9 1.7 - 7.7 K/uL   Lymphocytes Relative 40 %   Lymphs Abs 2.3 0.7 - 4.0 K/uL   Monocytes Relative 7 %   Monocytes Absolute 0.4 0.1 - 1.0 K/uL   Eosinophils Relative 3 %   Eosinophils Absolute 0.2 0.0 - 0.7 K/uL   Basophils Relative 0 %   Basophils Absolute 0.0 0.0 - 0.1 K/uL  Comprehensive metabolic panel     Status: Abnormal   Collection Time: 06/02/17 11:15 PM  Result Value  Ref Range   Sodium 138 135 - 145 mmol/L   Potassium 3.7 3.5 - 5.1 mmol/L   Chloride 108 101 - 111 mmol/L   CO2 26 22 - 32 mmol/L   Glucose, Bld 81 65 - 99 mg/dL   BUN 13 6 - 20 mg/dL   Creatinine, Ser 0.77 0.44 - 1.00 mg/dL   Calcium 8.7 (L) 8.9 - 10.3 mg/dL   Total Protein 7.1 6.5 - 8.1 g/dL   Albumin 3.7 3.5 - 5.0 g/dL   AST 20 15 - 41 U/L   ALT 17 14 - 54 U/L   Alkaline Phosphatase 42 38 - 126 U/L   Total Bilirubin 0.4 0.3 - 1.2 mg/dL   GFR calc non Af Amer >60 >60 mL/min   GFR calc Af Amer >60 >60 mL/min  Comment: (NOTE) The eGFR has been calculated using the CKD EPI equation. This calculation has not been validated in all clinical situations. eGFR's persistently <60 mL/min signify possible Chronic Kidney Disease.    Anion gap 4 (L) 5 - 15  Urinalysis, Routine w reflex microscopic     Status: Abnormal   Collection Time: 06/03/17  3:20 AM  Result Value Ref Range   Color, Urine YELLOW YELLOW   APPearance HAZY (A) CLEAR   Specific Gravity, Urine 1.027 1.005 - 1.030   pH 5.0 5.0 - 8.0   Glucose, UA NEGATIVE NEGATIVE mg/dL   Hgb urine dipstick NEGATIVE NEGATIVE   Bilirubin Urine NEGATIVE NEGATIVE   Ketones, ur 5 (A) NEGATIVE mg/dL   Protein, ur NEGATIVE NEGATIVE mg/dL   Nitrite POSITIVE (A) NEGATIVE   Leukocytes, UA TRACE (A) NEGATIVE   RBC / HPF 0-5 0 - 5 RBC/hpf   WBC, UA 6-30 0 - 5 WBC/hpf   Bacteria, UA FEW (A) NONE SEEN   Squamous Epithelial / LPF 6-30 (A) NONE SEEN   Mucus PRESENT   Pregnancy, urine     Status: None   Collection Time: 06/03/17  3:20 AM  Result Value Ref Range   Preg Test, Ur NEGATIVE NEGATIVE    Comment:        THE SENSITIVITY OF THIS METHODOLOGY IS >20 mIU/mL.     Studies/Results:  BRAIN MRI W/WO FINDINGS: Brain: Ventricle size normal.  Cerebral volume normal.  Multiple periventricular white matter hyperintensities. Most these are quite small, the largest lesions are in the left frontal and right parietal white matter, measuring  approximately 1 cm. Small hyperintensities also noted in the subcortical white matter in the frontal lobes bilaterally. No lesion show restricted diffusion or abnormal enhancement in the brain. Negative for hemorrhage or mass.  Note is made of enhancement of 2 adjacent lesions in the dorsal spinal cord at the C2 level compatible with active demyelinization  Vascular: Normal arterial flow void  Skull and upper cervical spine: Negative  Sinuses/Orbits: Mild mucosal edema paranasal sinuses.  Normal orbit  Other: None  IMPRESSION: Multiple periventricular white matter lesions compatible with multiple sclerosis. Brain lesions do not show abnormal enhancement or restricted diffusion  Two adjacent lesions in the dorsal spinal cord to the C2 level due show postcontrast enhancement compatible with active demyelinization.     C SPINE MRI W//O FINDINGS: Alignment: Straightening without subluxation  Vertebrae: No fracture, evidence of discitis, or bone lesion.  Cord: 2 dorsal cord T2 hyperintensities extending over a short segment (vertebral body height at each level) seen at C2 and C3-4. Normal cord morphology.  Posterior Fossa, vertebral arteries, paraspinal tissues: No convincing signal abnormality in the posterior fossa.  Disc levels:  No degenerative changes or impingement.  IMPRESSION: Two short-segment cord signal abnormalities at C2 and C3-4 posteriorly. Demyelination such as multiple sclerosis is favored. Recommend brain MRI with contrast.    T-SPINE MRI FINDINGS: Alignment:  Physiologic.  Vertebrae: No fracture, evidence of discitis, or bone lesion.  Cord: Limited visualization of the cord due to large field of view. This was reportedly a limitation of patient's body habitus.  Paraspinal and other soft tissues: Negative.  Disc levels:  T6-7 disc narrowing with tiny superiorly migrating disc protrusion. No impingement throughout the  thoracic spine.  IMPRESSION: 1. Normal appearance of the thoracic cord. 2. Mild disc degeneration at T6-7.     L-SPINE MRI W/O FINDINGS: Segmentation:  Standard.  Alignment:  Physiologic.  Vertebrae:  No fracture,  evidence of discitis, or bone lesion.  Conus medullaris and cauda equina: Conus extends to the L1/2 level. Conus and cauda equina appear normal.  Paraspinal and other soft tissues: Trace pelvic fluid that may be physiologic.  Disc levels:  No herniation or impingement.  IMPRESSION: Negative lumbar spine MRI.        Brain MRI scan of cervical spine MRI scan are reviewed in person. The brain MRI shows about 5 plaque-like lesions. The most significant largest one involves the left frontal lobe which constitute the frontal horn of lateral ventricle. There is also a larger deep white matter one involving the parietal lobe on the right side. There is another one involving the left frontal horn which is close to the corpus callosum but does not clearly involve the corpus callosum. There are 2 enhancing lesions involving the posterior cord cervical level proximally C2 and C3. No enhancing lesion of the brain lesions however.     Mady Oubre A. Merlene Laughter, M.D.  Diplomate, Tax adviser of Psychiatry and Neurology ( Neurology). 06/03/2017, 7:14 PM

## 2017-06-03 NOTE — H&P (Signed)
History and Physical    BURKLEY DECH BJY:[id number] DOB: [birth year] DOA: 06/02/2017  PCP: [name], MD   Patient coming from: Home.  I have personally briefly reviewed patient's old medical records in [facility]  Chief Complaint: Lower extremities tingling and numbness.  HPI: Sherri Woodward is a 31 y.o. female with medical history significant of HSV-2 positivity, tobacco use who is coming to the emergency department with complaints of several days of intermittent mid rib cage numbness associated with lower extremities tingling, numbness and weakness, slightly worse on the right. She mentions having significant weakness after just one flight of stairs at work. She denies previous episodes of this. Denies fever, headache, chills, rhinorrhea, sore throat, dyspnea, chest pain, palpitations, diaphoresis, pitting edema of the lower extremities, PND or orthopnea.  No abdominal pain, nausea, emesis, diarrhea, melena, hematochezia or constipation.  Denies GU symptoms.  ED Course: Initial vital signs temperature 98.23F, pulse 73, blood pressure 150/104 mmHg, respirations 18 and O2 sat 99% on room air. The patient's case was presented to neurology on call who suggested observation admission, high-dose Solu-Medrol and MRIs of spinal cord/brain to rule out multiple sclerosis, given her symptoms and since the patient's father has history of it.  Her CBC and CMP were unremarkable, except for mildly low calcium of 8.7 mg/dL.Marland Kitchen  Review of Systems: As per HPI otherwise 10 point review of systems negative.    Past Medical History:  Diagnosis Date  . 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 Medical history non-contributory   . Pregnancy   . Smoker 08/10/2013    Past Surgical History:  Procedure Laterality Date  . CESAREAN SECTION    . REPEAT CESAREAN SECTION N/A 02/25/2014   Performed by [name], MD at [facility]  . TONGUE SURGERY     due to  accident as child     reports that she has been smoking cigarettes.  She has been smoking about 0.25 packs per day. she has never used smokeless tobacco. She reports that she drinks alcohol. She reports that she uses drugs. Drug: Marijuana. Frequency: 7.00 times per week.  No Known Allergies  Family History  Problem Relation Age of Onset  . Hypertension Mother   . Stroke Father   . Heart Problems Father   . Multiple sclerosis Father   . Seizures Father   . Hypertension Maternal Grandfather   . Kidney disease Maternal Grandfather     Prior to Admission medications   Medication Sig Start Date End Date Taking? Authorizing Provider  acetaminophen-codeine (TYLENOL #3) 300-30 MG tablet One tablet every four hours as needed for pain.  Must last TEN days. 10/10/15   Darreld Mclean, MD  levonorgestrel (LILETTA, 52 MG,) 18.6 MCG/DAY IUD IUD 1 each by Intrauterine route once.    [provider]  naproxen (NAPROSYN) 500 MG tablet Take 1 tablet (500 mg total) by mouth 2 (two) times daily with a meal. 04/03/17   Darreld Mclean, MD  oxyCODONE-acetaminophen (PERCOCET/ROXICET) 5-325 MG tablet Take 1-2 tablets by mouth every 6 (six) hours as needed. 10/06/15   Ward, Layla Maw, DO    Physical Exam: Vitals:   06/02/17 2054 06/02/17 2055 06/03/17 0000 06/03/17 0113  BP:  (!) 152/104 (!) 128/94 (!) 151/97  Pulse: 73  64 61  Resp: 18  17 17   Temp: 98.4 F (36.9 C)     TempSrc: Oral     SpO2: 99%  98%  99%  Weight:  115.2 kg (254 lb)    Height: 5\' 4"  (1.626 m)       Constitutional: NAD, calm, comfortable Eyes: PERRL, lids and conjunctivae normal ENMT: Mucous membranes are moist. Posterior pharynx clear of any exudate or lesions. Neck: normal, supple, no masses, no thyromegaly Respiratory: clear to auscultation bilaterally, no wheezing, no crackles. Normal respiratory effort. No accessory muscle use.  Cardiovascular: Regular rate and rhythm, no murmurs / rubs / gallops. No extremity edema.  2+ pedal pulses. No carotid bruits.  Abdomen: Obese, soft, no tenderness, no masses palpated. No hepatosplenomegaly. Bowel sounds positive.  Musculoskeletal: no clubbing / cyanosis. Good ROM, no contractures. Normal muscle tone.  Skin: no significant rashes, lesions, ulcers on limited dermatological exam. Neurologic: CN 2-12 grossly intact. Subjective numbness and tingling of both lower extremities, patellar reflexes are mildly decreased. Strength 5/5 in all 4.  Psychiatric: Normal judgment and insight. Alert and oriented x 4. Normal mood.    Labs on Admission: I have personally reviewed following labs and imaging studies  CBC: Recent Labs  Lab 06/02/17 2315  WBC 5.9  NEUTROABS 2.9  HGB 13.0  HCT 40.1  MCV 99.0  PLT 252   Basic Metabolic Panel: Recent Labs  Lab 06/02/17 2315  NA 138  K 3.7  CL 108  CO2 26  GLUCOSE 81  BUN 13  CREATININE 0.77  CALCIUM 8.7*   GFR: Estimated Creatinine Clearance: 129.2 mL/min (by C-G formula based on SCr of 0.77 mg/dL). Liver Function Tests: Recent Labs  Lab 06/02/17 2315  AST 20  ALT 17  ALKPHOS 42  BILITOT 0.4  PROT 7.1  ALBUMIN 3.7   No results for input(s): LIPASE, AMYLASE in the last 168 hours. No results for input(s): AMMONIA in the last 168 hours. Coagulation Profile: No results for input(s): INR, PROTIME in the last 168 hours. Cardiac Enzymes: No results for input(s): CKTOTAL, CKMB, CKMBINDEX, TROPONINI in the last 168 hours. BNP (last 3 results) No results for input(s): PROBNP in the last 8760 hours. HbA1C: No results for input(s): HGBA1C in the last 72 hours. CBG: No results for input(s): GLUCAP in the last 168 hours. Lipid Profile: No results for input(s): CHOL, HDL, LDLCALC, TRIG, CHOLHDL, LDLDIRECT in the last 72 hours. Thyroid Function Tests: No results for input(s): TSH, T4TOTAL, FREET4, T3FREE, THYROIDAB in the last 72 hours. Anemia Panel: No results for input(s): VITAMINB12, FOLATE, FERRITIN, TIBC, IRON,  RETICCTPCT in the last 72 hours. Urine analysis:    Component Value Date/Time   COLORURINE YELLOW 08/10/2013 1631   APPEARANCEUR CLOUDY (A) 08/10/2013 1631   LABSPEC 1.025 08/10/2013 1631   PHURINE 7.0 08/10/2013 1631   GLUCOSEU NEG 08/10/2013 1631   HGBUR NEG 08/10/2013 1631   BILIRUBINUR NEG 08/10/2013 1631   KETONESUR NEG 08/10/2013 1631   PROTEINUR trace 02/21/2014 1342   PROTEINUR NEG 08/10/2013 1631   UROBILINOGEN 1 08/10/2013 1631   NITRITE neg 02/21/2014 1342   NITRITE NEG 08/10/2013 1631   LEUKOCYTESUR Negative 02/21/2014 1342    Radiological Exams on Admission: No results found.  EKG: Independently reviewed.   Assessment/Plan Principal Problem:   Numbness and tingling of both lower extremities Observation/telemetry. Frequent neuro checks. Solu-Medrol 1 g IVPB every 8 hours as suggested by neurology. Check MRIs of CNS to rule out multiple sclerosis or any other demyelinating diseases. Consult neurology if needed.  Active Problems:   Tobacco use Nicotine replacement therapy ordered. Staff to provide tobacco cessation information.    DVT prophylaxis: Lovenox  SQ. Code Status: Full code. Family Communication:  Disposition Plan: Observation, high-dose IV methylprednisolone and MRIs in a.m as suggested by neurology.. Consults called:  Admission status: Observation/telemetry   Bobette Moavid Manuel Ortiz MD [facility] Pager [phone].  If 7PM-7AM, please contact night-coverage [email or web address] Password [email or web address]  06/03/2017, 1:51 AM

## 2017-06-04 LAB — VITAMIN B12: Vitamin B-12: 580 pg/mL (ref 180–914)

## 2017-06-04 LAB — C-REACTIVE PROTEIN: CRP: <0.8 mg/dL (ref <1.0)

## 2017-06-04 NOTE — Discharge Summary (Signed)
Physician Discharge Summary  Sherri Woodward ZOX:[id number] DOB: [birth year] DOA: 06/02/2017  PCP: [name], MD  Admit date: 06/02/2017 Discharge date: 06/04/2017  Time spent: 45 minutes  Recommendations for Outpatient Follow-up:  -Will be discharged home today. -Will need follow up with Dr. Gerilyn Pilgrim in 4 weeks.   Discharge Diagnoses:  Principal Problem:   Numbness and tingling of both lower extremities Active Problems:   Tobacco use   Numbness and tingling   Discharge Condition: Stable and improved  Filed Weights   06/02/17 2055  Weight: 115.2 kg (254 lb)    History of present illness:  As per Dr. Robb Matar on 11/20: Sherri Woodward is a 31 y.o. female with medical history significant of HSV-2 positivity, tobacco use who is coming to the emergency department with complaints of several days of intermittent mid rib cage numbness associated with lower extremities tingling, numbness and weakness, slightly worse on the right. She mentions having significant weakness after just one flight of stairs at work. She denies previous episodes of this. Denies fever, headache, chills, rhinorrhea, sore throat, dyspnea, chest pain, palpitations, diaphoresis, pitting edema of the lower extremities, PND or orthopnea.  No abdominal pain, nausea, emesis, diarrhea, melena, hematochezia or constipation.  Denies GU symptoms.  ED Course: Initial vital signs temperature 98.6F, pulse 73, blood pressure 150/104 mmHg, respirations 18 and O2 sat 99% on room air. The patient's case was presented to neurology on call who suggested observation admission, high-dose Solu-Medrol and MRIs of spinal cord/brain to rule out multiple sclerosis, given her symptoms and since the patient's father has history of it.  Her CBC and CMP were unremarkable, except for mildly low calcium of 8.7 mg/dL.Marland Kitchen    Hospital Course:   Numbness/Tingling of Bilateral LE -MRI findings seem to consistent with MS. -Has received  high dose solumedrol x 2 days as per neurology recommendations. -She feels improved. -Will need f/u with Dr. Gerilyn Pilgrim in 4 weeks to discuss  Procedures:  None   Consultations:  Neurology  Discharge Instructions  Discharge Instructions    Diet - low sodium heart healthy   Complete by:  As directed    Increase activity slowly   Complete by:  As directed      Allergies as of 06/04/2017   No Known Allergies     Medication List    STOP taking these medications   acetaminophen-codeine 300-30 MG tablet Commonly known as:  TYLENOL #3   oxyCODONE-acetaminophen 5-325 MG tablet Commonly known as:  PERCOCET/ROXICET     TAKE these medications   LILETTA (52 MG) 18.6 MCG/DAY Iud IUD Generic drug:  levonorgestrel 1 each by Intrauterine route once.   naproxen 500 MG tablet Commonly known as:  NAPROSYN Take 1 tablet (500 mg total) by mouth 2 (two) times daily with a meal.      No Known Allergies Follow-up Information    Beryle Beams, MD Follow up in 4 week(s).   Specialty:  Neurology Contact information: [address] [place] [address] [phone]            The results of significant diagnostics from this hospitalization (including imaging, microbiology, ancillary and laboratory) are listed below for reference.    Significant Diagnostic Studies: Mr Laqueta Jean Wo Contrast  Result Date: 06/03/2017 CLINICAL DATA:  Numbness and tingling paresthesia EXAM: MRI HEAD WITHOUT AND WITH CONTRAST TECHNIQUE: Multiplanar, multiecho pulse sequences of the brain and surrounding structures were obtained without and with intravenous contrast. CONTRAST:  20mL MULTIHANCE  GADOBENATE DIMEGLUMINE 529 MG/ML IV SOLN COMPARISON:  Cervical spine MRI 06/03/2017 FINDINGS: Brain: Ventricle size normal.  Cerebral volume normal. Multiple periventricular white matter hyperintensities. Most these are quite small, the largest lesions are in the left frontal and right parietal white matter,  measuring approximately 1 cm. Small hyperintensities also noted in the subcortical white matter in the frontal lobes bilaterally. No lesion show restricted diffusion or abnormal enhancement in the brain. Negative for hemorrhage or mass. Note is made of enhancement of 2 adjacent lesions in the dorsal spinal cord at the C2 level compatible with active demyelinization Vascular: Normal arterial flow void Skull and upper cervical spine: Negative Sinuses/Orbits: Mild mucosal edema paranasal sinuses.  Normal orbit Other: None IMPRESSION: Multiple periventricular white matter lesions compatible with multiple sclerosis. Brain lesions do not show abnormal enhancement or restricted diffusion Two adjacent lesions in the dorsal spinal cord to the C2 level due show postcontrast enhancement compatible with active demyelinization. Electronically Signed   By: Marlan Palauharles  Clark M.D.   On: 06/03/2017 13:39   Mr Cervical Spine Wo Contrast  Result Date: 06/03/2017 CLINICAL DATA:  Bilateral arm and leg weakness for 1 week. EXAM: MRI CERVICAL SPINE WITHOUT CONTRAST TECHNIQUE: Multiplanar, multisequence MR imaging of the cervical spine was performed. No intravenous contrast was administered. COMPARISON:  None. FINDINGS: Alignment: Straightening without subluxation Vertebrae: No fracture, evidence of discitis, or bone lesion. Cord: 2 dorsal cord T2 hyperintensities extending over a short segment (vertebral body height at each level) seen at C2 and C3-4. Normal cord morphology. Posterior Fossa, vertebral arteries, paraspinal tissues: No convincing signal abnormality in the posterior fossa. Disc levels: No degenerative changes or impingement. IMPRESSION: Two short-segment cord signal abnormalities at C2 and C3-4 posteriorly. Demyelination such as multiple sclerosis is favored. Recommend brain MRI with contrast. Electronically Signed   By: Marnee SpringJonathon  Watts M.D.   On: 06/03/2017 09:31   Mr Thoracic Spine Wo Contrast  Result Date:  06/03/2017 CLINICAL DATA:  Bilateral arm and leg weakness for 1 week. EXAM: MRI THORACIC SPINE WITHOUT CONTRAST TECHNIQUE: Multiplanar, multisequence MR imaging of the thoracic spine was performed. No intravenous contrast was administered. COMPARISON:  None. FINDINGS: Alignment:  Physiologic. Vertebrae: No fracture, evidence of discitis, or bone lesion. Cord: Limited visualization of the cord due to large field of view. This was reportedly a limitation of patient's body habitus. Paraspinal and other soft tissues: Negative. Disc levels: T6-7 disc narrowing with tiny superiorly migrating disc protrusion. No impingement throughout the thoracic spine. IMPRESSION: 1. Normal appearance of the thoracic cord. 2. Mild disc degeneration at T6-7. Electronically Signed   By: Marnee SpringJonathon  Watts M.D.   On: 06/03/2017 09:34   Mr Lumbar Spine Wo Contrast  Result Date: 06/03/2017 CLINICAL DATA:  Bilateral arm and leg weakness for 1 week. EXAM: MRI LUMBAR SPINE WITHOUT CONTRAST TECHNIQUE: Multiplanar, multisequence MR imaging of the lumbar spine was performed. No intravenous contrast was administered. COMPARISON:  None. FINDINGS: Segmentation:  Standard. Alignment:  Physiologic. Vertebrae:  No fracture, evidence of discitis, or bone lesion. Conus medullaris and cauda equina: Conus extends to the L1/2 level. Conus and cauda equina appear normal. Paraspinal and other soft tissues: Trace pelvic fluid that may be physiologic. Disc levels: No herniation or impingement. IMPRESSION: Negative lumbar spine MRI. Electronically Signed   By: Marnee SpringJonathon  Watts M.D.   On: 06/03/2017 09:36    Microbiology: No results found for this or any previous visit (from the past 240 hour(s)).   Labs: Basic Metabolic Panel: Recent Labs  Lab  06/02/17 2315  NA 138  K 3.7  CL 108  CO2 26  GLUCOSE 81  BUN 13  CREATININE 0.77  CALCIUM 8.7*   Liver Function Tests: Recent Labs  Lab 06/02/17 2315  AST 20  ALT 17  ALKPHOS 42  BILITOT 0.4    PROT 7.1  ALBUMIN 3.7   No results for input(s): LIPASE, AMYLASE in the last 168 hours. No results for input(s): AMMONIA in the last 168 hours. CBC: Recent Labs  Lab 06/02/17 2315  WBC 5.9  NEUTROABS 2.9  HGB 13.0  HCT 40.1  MCV 99.0  PLT 252   Cardiac Enzymes: No results for input(s): CKTOTAL, CKMB, CKMBINDEX, TROPONINI in the last 168 hours. BNP: BNP (last 3 results) No results for input(s): BNP in the last 8760 hours.  ProBNP (last 3 results) No results for input(s): PROBNP in the last 8760 hours.  CBG: No results for input(s): GLUCAP in the last 168 hours.     Signed:  Chaya Jan  [facility] Pager: [phone] 06/04/2017, 5:43 PM

## 2017-06-04 NOTE — Progress Notes (Signed)
Sherri Woodward discharged Home per MD order.  Discharge instructions reviewed and discussed with the patient, all questions and concerns answered. Copy of instructions given to patient.  Allergies as of 06/04/2017   No Known Allergies     Medication List    STOP taking these medications   acetaminophen-codeine 300-30 MG tablet Commonly known as:  TYLENOL #3   oxyCODONE-acetaminophen 5-325 MG tablet Commonly known as:  PERCOCET/ROXICET     TAKE these medications   LILETTA (52 MG) 18.6 MCG/DAY Iud IUD Generic drug:  levonorgestrel 1 each by Intrauterine route once.   naproxen 500 MG tablet Commonly known as:  NAPROSYN Take 1 tablet (500 mg total) by mouth 2 (two) times daily with a meal.       Patients skin is clean, dry and intact, no evidence of skin break down. IV site discontinued and catheter remains intact. Site without signs and symptoms of complications. Dressing and pressure applied.  Patient escorted to elevator,  no distress noted upon discharge.  Sherri Woodward 06/04/2017 12:22 PM

## 2017-06-05 LAB — RPR: RPR: NONREACTIVE

## 2017-06-05 LAB — HIV ANTIBODY (ROUTINE TESTING W REFLEX): HIV SCREEN 4TH GENERATION: NONREACTIVE

## 2017-06-06 LAB — ANTINUCLEAR ANTIBODIES, IFA: ANTINUCLEAR ANTIBODIES, IFA: NEGATIVE

## 2017-06-06 LAB — HOMOCYSTEINE: HOMOCYSTEINE-NORM: 3.4 umol/L (ref 0.0–15.0)

## 2018-04-09 ENCOUNTER — Encounter: Payer: Self-pay | Admitting: Advanced Practice Midwife

## 2018-04-23 ENCOUNTER — Encounter: Payer: Self-pay | Admitting: Advanced Practice Midwife

## 2018-07-13 IMAGING — MR MR LUMBAR SPINE W/O CM
4 of 5 series · 13 of 48 positions shown · non-contrast
Comparison: None.

CLINICAL DATA: Bilateral arm and leg weakness for 1 week.

EXAM:
MRI LUMBAR SPINE WITHOUT CONTRAST
TECHNIQUE: Multiplanar, multisequence MR imaging of the lumbar spine was
performed. No intravenous contrast was administered.

[Series 7: T2 · sagittal · 4.0mm · 0.45mm/px · 4 of 13 slices shown (1 of 3)]
[im 1/13]
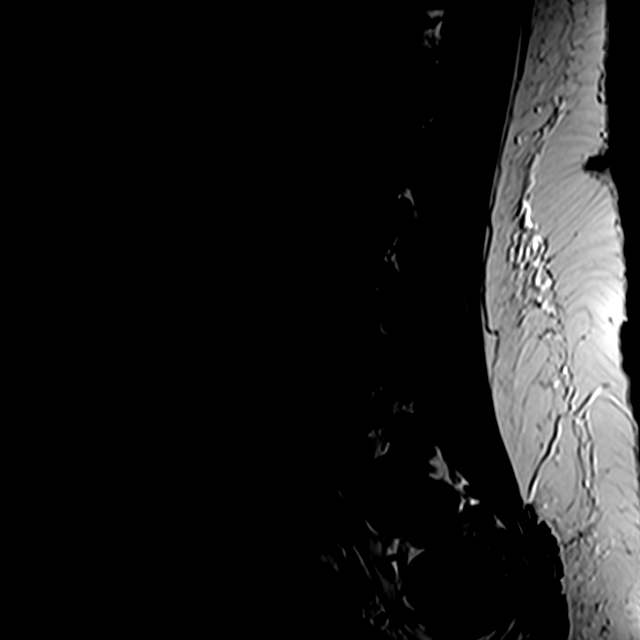
[im 4/13]
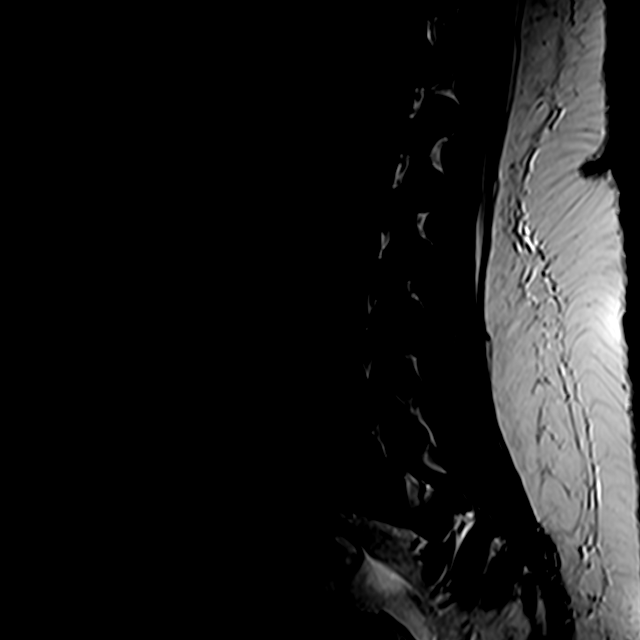
[im 7/13]
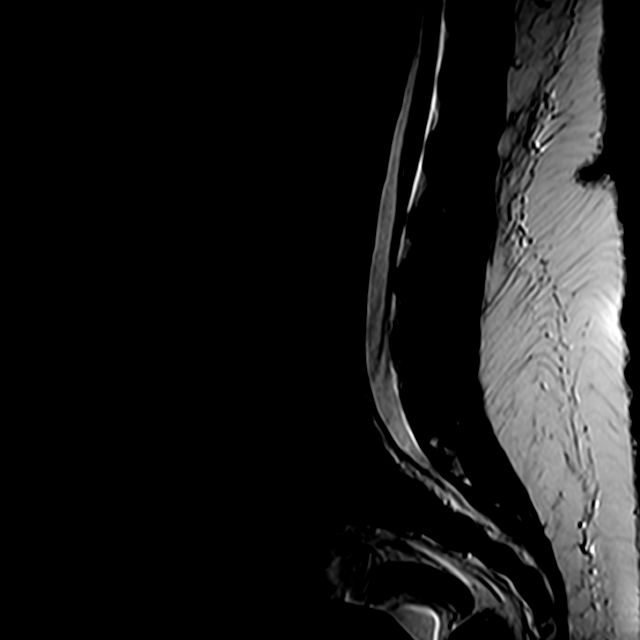
[im 13/13]
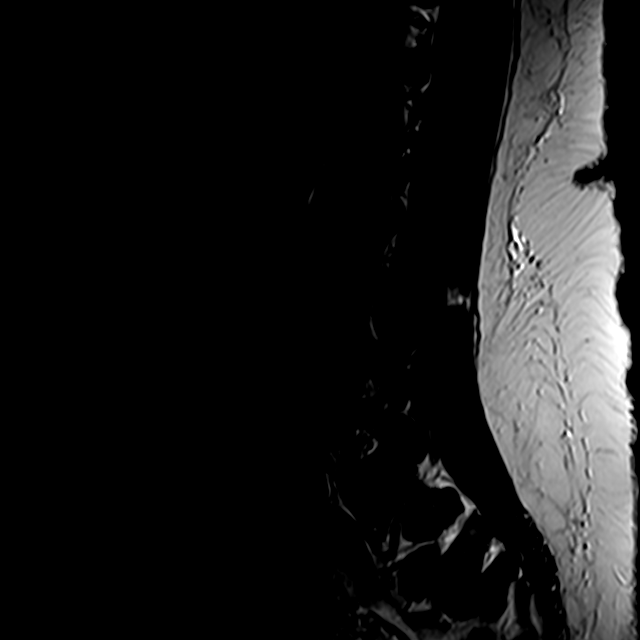

[Series 8: T1 · sagittal · 4.0mm · 0.45mm/px · 3 of 13 slices shown]
[im 1/13]
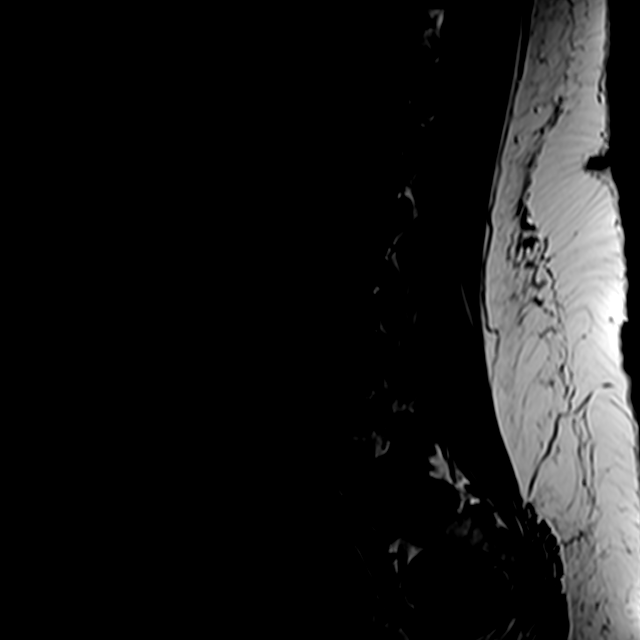
[im 7/13]
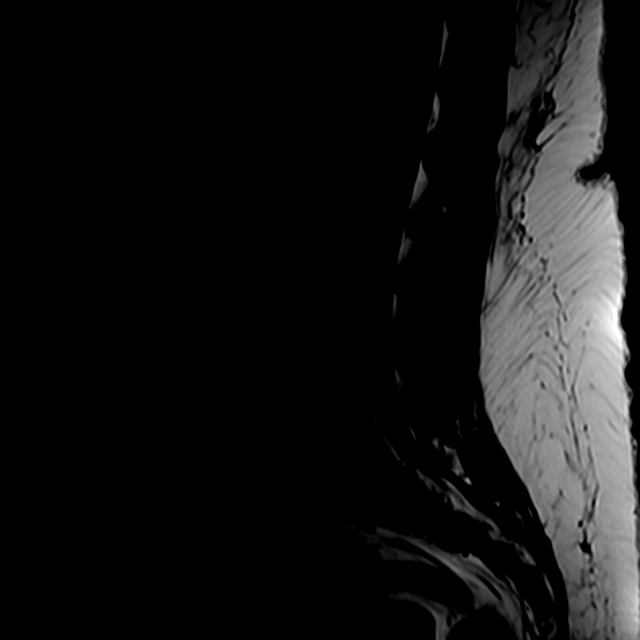
[im 13/13]
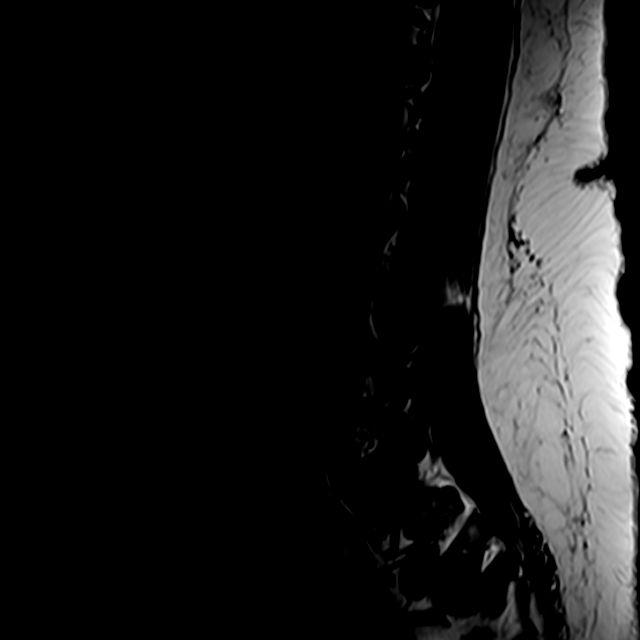

[Series 9: T2 · sagittal · 4.0mm · 0.45mm/px · 3 of 16 slices shown (2 of 3)]
[im 4/16]
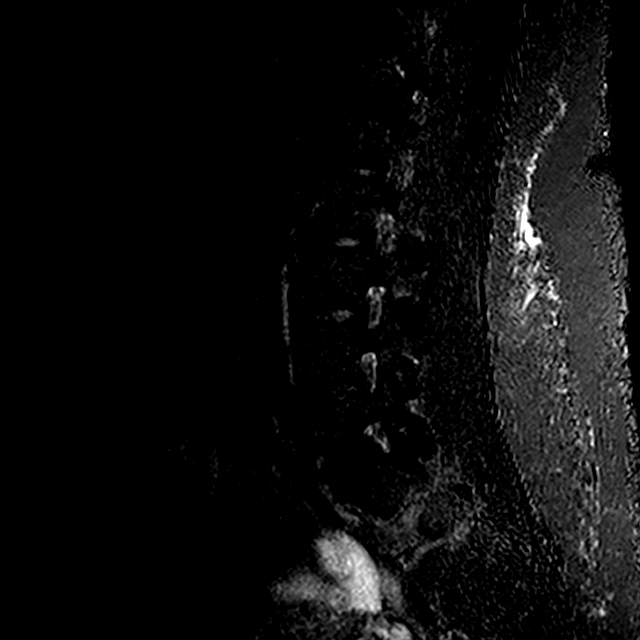
[im 10/16]
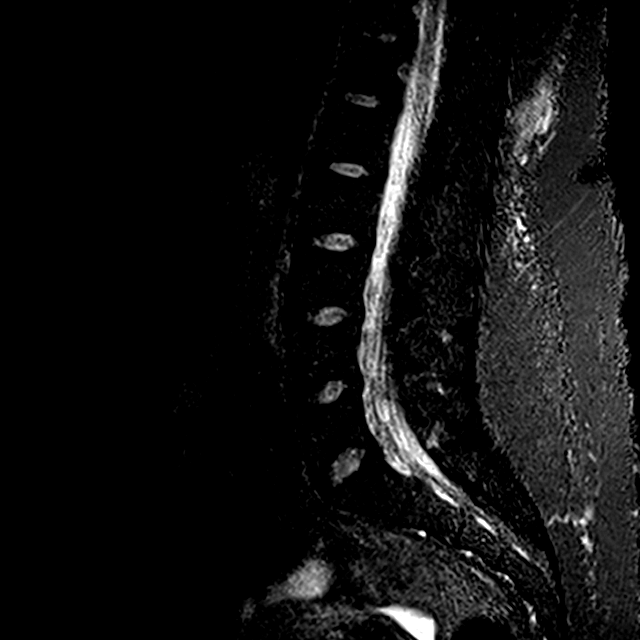
[im 16/16]
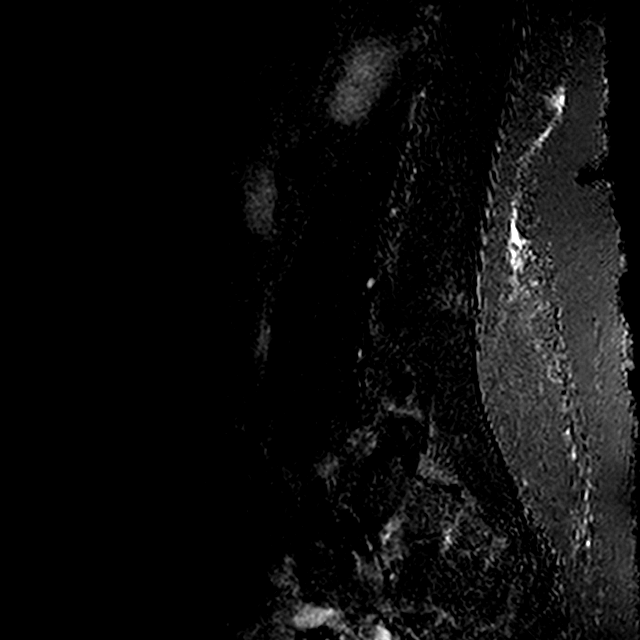

[Series 10: T2 · axial · 4.0mm · 0.26mm/px · z∈[-454,-297]mm · 3 of 40 slices shown (3 of 3)]
[im 6/40]
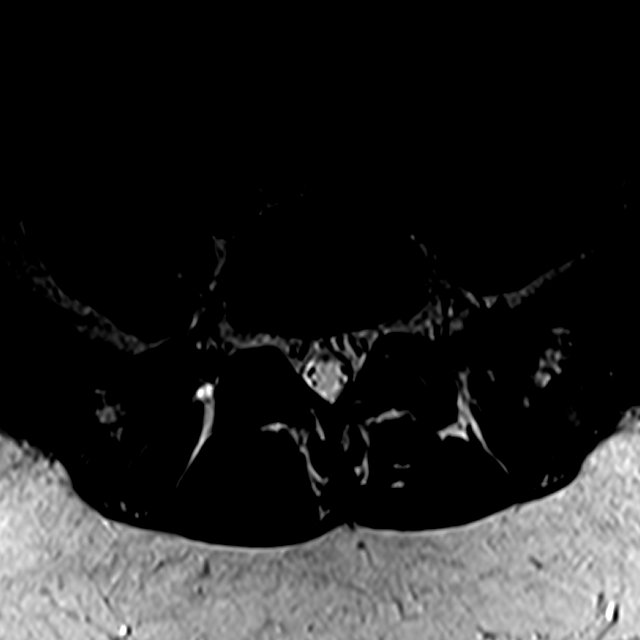
[im 21/40]
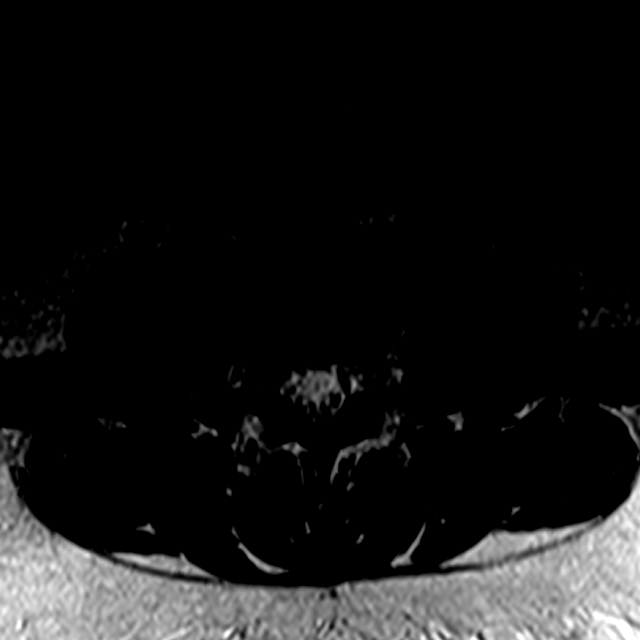
[im 34/40]
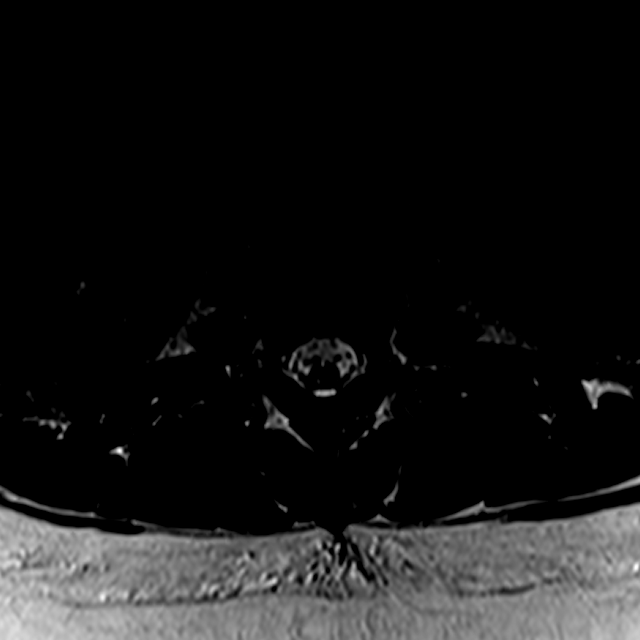

[13 of 48 positions shown; findings below may reference images not displayed]

FINDINGS: Segmentation:  Standard.

Alignment:  Physiologic.

Vertebrae:  No fracture, evidence of discitis, or bone lesion.

Conus medullaris and cauda equina: Conus extends to the L1/2 level.
Conus and cauda equina appear normal.

Paraspinal and other soft tissues: Trace pelvic fluid that may be
physiologic.

Disc levels:

No herniation or impingement.
IMPRESSION: Negative lumbar spine MRI.

## 2019-11-24 ENCOUNTER — Other Ambulatory Visit: Payer: Self-pay

## 2019-11-24 ENCOUNTER — Ambulatory Visit: Payer: Medicaid Other | Admitting: Women's Health

## 2019-11-24 ENCOUNTER — Encounter: Payer: Self-pay | Admitting: Women's Health

## 2019-11-24 VITALS — BP 144/99 | HR 71 | Ht 63.0 in | Wt 258.0 lb

## 2019-11-24 DIAGNOSIS — Z30431 Encounter for routine checking of intrauterine contraceptive device: Secondary | ICD-10-CM | POA: Diagnosis not present

## 2019-11-24 DIAGNOSIS — R030 Elevated blood-pressure reading, without diagnosis of hypertension: Secondary | ICD-10-CM | POA: Diagnosis not present

## 2019-11-24 NOTE — Progress Notes (Signed)
   GYN VISIT Patient name: Sherri Woodward MRN [id number]  Date of birth: [birth year] Chief Complaint:   IUD check  History of Present Illness:   Sherri Woodward is a 34 y.o. G4P2002 African American female being seen today for wanting to make sure IUD is still in place, can't feel strings. Liletta inserted 04/20/14, wants another when this one is due to come out. Boyfriend wants to have another baby, but she does not right now.  BP elevated today, states she has a PCP in [place]- last saw them last year, bp was elevated, made appt to go back but office was closed, so hasn't been back. Mom has HTN. Pt has been having some headaches, relieved by ibuprofen. No flowsheet data found.  No LMP recorded. (Menstrual status: IUD). The current method of family planning is IUD.  Last pap 08/10/13. Results were:  normal Review of Systems:   Pertinent items are noted in HPI Denies fever/chills, dizziness, headaches, visual disturbances, fatigue, shortness of breath, chest pain, abdominal pain, vomiting, abnormal vaginal discharge/itching/odor/irritation, problems with periods, bowel movements, urination, or intercourse unless otherwise stated above.  Pertinent History Reviewed:  Reviewed past medical,surgical, social, obstetrical and family history.  Reviewed problem list, medications and allergies. Physical Assessment:   Vitals:   11/24/19 0856  BP: (!) 144/99  Pulse: 71  Weight: 258 lb (117 kg)  Height: 5\' 3"  (1.6 m)  Body mass index is 45.7 kg/m.       Physical Examination:   General appearance: alert, well appearing, and in no distress  Mental status: alert, oriented to person, place, and time  Skin: warm & dry   Cardiovascular: normal heart rate noted  Respiratory: normal respiratory effort, no distress  Abdomen: soft, non-tender   Pelvic: VULVA: normal appearing vulva with no masses, tenderness or lesions, VAGINA: normal appearing vagina with normal color and discharge, no lesions, CERVIX:  normal appearing cervix without discharge or lesions, IUD strings not visible  Extremities: no edema   Informal TA u/s: IUD in correct placement in uterus  Chaperone: Peggy Dones    No results found for this or any previous visit (from the past 24 hour(s)).  Assessment & Plan:  1) IUD check> IUD in place, strings not visible, due to come out in Oct, wants another inserted then  2) Past due for pap> schedule today  3) Elevated bp> will recheck when comes back for pap, if still elevated will start meds  Meds: No orders of the defined types were placed in this encounter.   No orders of the defined types were placed in this encounter.   Return for 1st available, Pap & physical.  Nov CNM, [facility] 11/24/2019 9:27 AM

## 2019-12-09 ENCOUNTER — Other Ambulatory Visit: Payer: Medicaid Other | Admitting: Women's Health

## 2020-04-14 ENCOUNTER — Ambulatory Visit
Admission: EM | Admit: 2020-04-14 | Discharge: 2020-04-14 | Disposition: A | Discharge status: Home / Self Care | Payer: Medicaid Other | Attending: Emergency Medicine | Admitting: Emergency Medicine

## 2020-04-14 ENCOUNTER — Other Ambulatory Visit: Payer: Self-pay

## 2020-04-14 DIAGNOSIS — Z1152 Encounter for screening for COVID-19: Secondary | ICD-10-CM

## 2020-04-14 NOTE — ED Triage Notes (Signed)
Pt here for covid testing after possible covid exposure from son; denies sx

## 2020-04-16 LAB — NOVEL CORONAVIRUS, NAA: SARS-CoV-2, NAA: NOT DETECTED

## 2020-04-16 LAB — SARS-COV-2, NAA 2 DAY TAT

## 2020-05-02 ENCOUNTER — Other Ambulatory Visit: Payer: Self-pay

## 2020-05-02 ENCOUNTER — Emergency Department (HOSPITAL_COMMUNITY)
Admission: EM | Admit: 2020-05-02 | Discharge: 2020-05-03 | Disposition: A | Discharge status: Home / Self Care | Payer: Medicaid Other | Attending: Emergency Medicine | Admitting: Emergency Medicine

## 2020-05-02 ENCOUNTER — Encounter (HOSPITAL_COMMUNITY): Payer: Self-pay | Admitting: Emergency Medicine

## 2020-05-02 DIAGNOSIS — Z5321 Procedure and treatment not carried out due to patient leaving prior to being seen by health care provider: Secondary | ICD-10-CM | POA: Insufficient documentation

## 2020-05-02 DIAGNOSIS — R059 Cough, unspecified: Secondary | ICD-10-CM | POA: Diagnosis present

## 2020-05-02 DIAGNOSIS — Z20822 Contact with and (suspected) exposure to covid-19: Secondary | ICD-10-CM | POA: Diagnosis not present

## 2020-05-02 DIAGNOSIS — R519 Headache, unspecified: Secondary | ICD-10-CM | POA: Insufficient documentation

## 2020-05-02 DIAGNOSIS — M791 Myalgia, unspecified site: Secondary | ICD-10-CM | POA: Diagnosis not present

## 2020-05-02 LAB — RESPIRATORY PANEL BY RT PCR (FLU A&B, COVID)
Influenza A by PCR: NEGATIVE
Influenza B by PCR: NEGATIVE
SARS Coronavirus 2 by RT PCR: NEGATIVE

## 2020-05-02 NOTE — ED Triage Notes (Signed)
Pt believes she has COVID, states she has been coughing, sneezing, had a headache, generalized aches and can't taste her food.  No pain.

## 2020-05-03 NOTE — ED Notes (Signed)
Pt called to be roomed no answer. 

## 2021-05-10 ENCOUNTER — Ambulatory Visit: Payer: Medicaid Other | Admitting: Nurse Practitioner

## 2021-07-11 ENCOUNTER — Emergency Department (HOSPITAL_COMMUNITY)
Admission: EM | Admit: 2021-07-11 | Discharge: 2021-07-11 | Discharge status: Against Medical Advice | Payer: Medicaid Other

## 2021-07-11 NOTE — ED Notes (Signed)
Pt called x 3 for triage.

## 2021-07-11 NOTE — ED Notes (Signed)
PT called for triage X3 with no response °

## 2022-03-17 ENCOUNTER — Other Ambulatory Visit: Payer: Self-pay

## 2022-03-17 ENCOUNTER — Emergency Department (HOSPITAL_COMMUNITY): Payer: Medicaid Other

## 2022-03-17 ENCOUNTER — Encounter (HOSPITAL_COMMUNITY): Payer: Self-pay | Admitting: Emergency Medicine

## 2022-03-17 ENCOUNTER — Emergency Department (HOSPITAL_COMMUNITY)
Admission: EM | Admit: 2022-03-17 | Discharge: 2022-03-17 | Disposition: A | Discharge status: Home / Self Care | Payer: Medicaid Other | Attending: Emergency Medicine | Admitting: Emergency Medicine

## 2022-03-17 DIAGNOSIS — S8992XA Unspecified injury of left lower leg, initial encounter: Secondary | ICD-10-CM | POA: Diagnosis present

## 2022-03-17 DIAGNOSIS — S82142A Displaced bicondylar fracture of left tibia, initial encounter for closed fracture: Secondary | ICD-10-CM | POA: Diagnosis not present

## 2022-03-17 DIAGNOSIS — W1839XA Other fall on same level, initial encounter: Secondary | ICD-10-CM | POA: Insufficient documentation

## 2022-03-17 DIAGNOSIS — Y9301 Activity, walking, marching and hiking: Secondary | ICD-10-CM | POA: Insufficient documentation

## 2022-03-17 MED ORDER — IBUPROFEN 800 MG PO TABS: 800.0000 mg | ORAL_TABLET | Freq: Three times a day (TID) | ORAL | 0 refills | Status: DC

## 2022-03-17 MED ORDER — OXYCODONE-ACETAMINOPHEN 5-325 MG PO TABS
1.0000 | ORAL_TABLET | Freq: Once | ORAL | Status: AC
  Administered 2022-03-17: 1 via ORAL
  Filled 2022-03-17: qty 1

## 2022-03-17 MED ORDER — OXYCODONE-ACETAMINOPHEN 5-325 MG PO TABS: 1.0000 | ORAL_TABLET | Freq: Three times a day (TID) | ORAL | 0 refills | Status: DC | PRN

## 2022-03-17 NOTE — ED Provider Notes (Signed)
[facility]-EMERGENCY DEPT Provider Note   CSN: [id number] Arrival date & time: 03/17/22  1123     History  Chief Complaint  Patient presents with   Leg Pain    Sherri Woodward is a 36 y.o. female with no pertinent past medical history presenting today with left knee pain.  Reports that last night she was walking and her left knee gave out on her causing her to fall.  Did not hit her head or lose consciousness.  Says ever since then she has been unable to bend her knee or move her left lower extremity.  No numbness or tingling.  No previous injury to this knee.   Leg Pain      Home Medications Prior to Admission medications   Medication Sig Start Date End Date Taking? Authorizing Provider  levonorgestrel (LILETTA, 52 MG,) 18.6 MCG/DAY IUD IUD 1 each by Intrauterine route once.    [provider]  naproxen (NAPROSYN) 500 MG tablet Take 1 tablet (500 mg total) by mouth 2 (two) times daily with a meal. Patient not taking: Reported on 06/03/2017 04/03/17   Darreld Mclean, MD      Allergies    Patient has no known allergies.    Review of Systems   Review of Systems  Physical Exam Updated Vital Signs BP (!) 173/119 (BP Location: Left Arm)   Pulse (!) 108   Temp 98.2 F (36.8 C) (Oral)   Resp 18   SpO2 100%  Physical Exam Vitals and nursing note reviewed.  Constitutional:      Appearance: Normal appearance.  HENT:     Head: Normocephalic and atraumatic.  Eyes:     General: No scleral icterus.    Conjunctiva/sclera: Conjunctivae normal.  Cardiovascular:     Pulses: Normal pulses.     Comments: Palpable DP Pulmonary:     Effort: Pulmonary effort is normal. No respiratory distress.  Musculoskeletal:     Comments: Unable to participate in active range of motion of her left knee.  Passive range of motion intact.  Tenderness with anterior drawer testing however no laxity.  Skin:    General: Skin is warm and dry.     Findings: No rash.   Neurological:     Mental Status: She is alert.  Psychiatric:        Mood and Affect: Mood normal.     ED Results / Procedures / Treatments   Labs (all labs ordered are listed, but only abnormal results are displayed) Labs Reviewed - No data to display  EKG None  Radiology DG Knee Complete 4 Views Left  Result Date: 03/17/2022 CLINICAL DATA:  36 year old female with left knee injury, pain. EXAM: LEFT KNEE - COMPLETE 4+ VIEW COMPARISON:  None Available. FINDINGS: Bone mineralization is within normal limits. Normal joint spaces and alignment. Patella appears intact. No acute osseous abnormality identified. No convincing joint effusion. Soft tissue contours are within normal limits. IMPRESSION: Negative. Electronically Signed   By: Odessa Fleming M.D.   On: 03/17/2022 11:54    Procedures Procedures   Medications Ordered in ED Medications  oxyCODONE-acetaminophen (PERCOCET/ROXICET) 5-325 MG per tablet 1 tablet (1 tablet Oral Given 03/17/22 1225)    ED Course/ Medical Decision Making/ A&P                           Medical Decision Making Amount and/or Complexity of Data Reviewed Radiology: ordered.  Risk Prescription drug management.  36 year old female presenting today with left knee injury.  Knee gave out on her while she was walking.  Treatment: Given Percocet due to severe pain  Imaging: Knee x-ray ordered, viewed and interpreted by me.  I agree with radiologist that this is negative.  Due to patient's poor range of motion, CT imaging was ordered.  I agree with the radiologist that patient has a left-sided depressed tibial plateau fracture.  Consults: I spoke with Dr. Carola Frost with orthopedic surgery and he recommended a hinged knee splint, crutches, nonweightbearing status and follow-up outpatient.  Patient is agreeable to this.  MDM/disposition: This is a 36 year old female presenting with left knee discomfort.  CT revealing of tibial plateau fracture.  Neurovascularly intact.  I  spoke with orthopedics who will see her this upcoming week in the office.  She will be sent home with Percocet and 800 mg ibuprofen.  She understands her nonweightbearing status and instructions for follow-up.  Discharged and neurovascularly intact condition   Final Clinical Impression(s) / ED Diagnoses Final diagnoses:  Closed fracture of left tibial plateau, initial encounter    Rx / DC Orders ED Discharge Orders          Ordered    ibuprofen (ADVIL) 800 MG tablet  3 times daily        03/17/22 1404    oxyCODONE-acetaminophen (PERCOCET/ROXICET) 5-325 MG tablet  Every 8 hours PRN        03/17/22 1404          Results and diagnoses were explained to the patient. Return precautions discussed in full. Patient had no additional questions and expressed complete understanding.   This chart was dictated using voice recognition software.  Despite best efforts to proofread,  errors can occur which can change the documentation meaning.     Saddie Benders, PA-C 03/17/22 1419    Arby Barrette, MD 03/17/22 1626

## 2022-03-17 NOTE — ED Triage Notes (Signed)
Pt reports left knee pain since yesterday. Pt reports knee is swollen

## 2022-03-17 NOTE — Discharge Instructions (Addendum)
You have a tibial plateau fracture.  Read the information about this attached to these papers.  Call the orthopedic surgeon for an appointment first thing Tuesday morning.  I have sent oxycodone to your pharmacy, this is a controlled substance.  Keep it out of the reach of others and do not drive or operate machinery on this.  Ibuprofen should be used as well.  You may want to take this with food as it may upset your stomach.  This will help with pain and inflammation as well.  You should not be putting any weight on your knee and keep it elevated and iced in 20-minute increments while at home.  Your work note is attached.  Return with any worsening symptoms.  Otherwise, it was a pleasure to meet you I hope you feel better!

## 2022-03-17 NOTE — Progress Notes (Signed)
Orthopedic Tech Progress Note Patient Details:  Sherri Woodward Mar 15, 1986 [id number]  Ortho Devices Type of Ortho Device: Knee Sleeve, Crutches Ortho Device/Splint Location: lle Ortho Device/Splint Interventions: Application, Ordered, Adjustment   Post Interventions Patient Tolerated: Well Instructions Provided: Adjustment of device, Poper ambulation with device, Care of device  Sherri Woodward [name] 03/17/2022, 3:18 PM

## 2022-03-17 NOTE — ED Notes (Signed)
Pt waiting for outside vendor brace. Ortho tech put in the order for same.

## 2022-04-03 ENCOUNTER — Other Ambulatory Visit: Payer: Self-pay | Admitting: Orthopedic Surgery

## 2022-04-03 DIAGNOSIS — S83412A Sprain of medial collateral ligament of left knee, initial encounter: Secondary | ICD-10-CM

## 2022-04-03 DIAGNOSIS — S82122A Displaced fracture of lateral condyle of left tibia, initial encounter for closed fracture: Secondary | ICD-10-CM

## 2022-04-16 NOTE — Progress Notes (Unsigned)
[facility]  PATIENT: Sherri Woodward Woodward DOB: 1986-04-09  REFERRING DOCTOR OR PCP: Tereasa Coop, PA-C, SOURCE: Patient, notes from primary care, hospital notes from 2018, imaging and lab reports, MRI images personally reviewed.  _________________________________   HISTORICAL  CHIEF COMPLAINT:  Chief Complaint  Patient presents with   New Patient (Initial Visit)    Pt in room #1 with mother.     HISTORY OF PRESENT ILLNESS:  I had the pleasure of seeing patient, Sherri Woodward, at the [facility] for neurologic consultation regarding her multiple sclerosis.  She presented to [facility] 06/02/2017 at the age of 36 after the onset of paresthesias in her trunk and legs.  Symptoms developed over a couple days.   Additionally she had difficulty with weakness, right greater than left and had difficulty climbing stairs.  She had MRI of the brain and cervical spine that were consistent with multiple sclerosis.  She received several days of high-dose Solu-Medrol with some improvement of symptoms.   She saw Dr. Merlene Laughter and was laced on Tecfidera.   She was only on the medication for a few months.   She had stomach upset.   She stopped after a few months whrn her prescription ran out and has not seen neurology or been on a DMT since 2019.      She has not had any MRIs since 2018.      Currently, she was able to walk independently but balance was poor..    Symptoms in the legs have worsened over the last 2 years.   She had a lot of trips and fell.  Most recently she broke her left leg with a fall (currently using crutches).    Her left leg is weak and spastic.    She notes the numbness in her legs improved but she still has numbness in her hands,   She has urinary urgency with urge incontinence at times.     No hesitancy.   No recent UTI.   Vision is doing well.  She sleep ok most nights.   She denies excessive fatigue.   She has no  cognitive issues.   She has ad mild depression since she needed to quit her job due to the left knee fracture.         She is working as a Training and development officer and she notes her right hand cramps up sometimes and she needs to shake it.     Her father had MS and became bedridden / wheelchair  Imaging: MRI of the brain 06/03/2017 showed scattered T2/FLAIR hyperintense foci in the periventricular white matter.  No infratentorial lesions in the brain  MRI of the cervical and thoracic spine 06/03/2017 shows T2 hyperintense foci in the posterior spinal cord adjacent to C2 and adjacent to C3-C4.  REVIEW OF SYSTEMS: Constitutional: No fevers, chills, sweats, or change in appetite Eyes: No visual changes, double vision, eye pain Ear, nose and throat: No hearing loss, ear pain, nasal congestion, sore throat Cardiovascular: No chest pain, palpitations Respiratory:  No shortness of breath at rest or with exertion.   No wheezes GastrointestinaI: No nausea, vomiting, diarrhea, abdominal pain, fecal incontinence Genitourinary:  No dysuria, urinary retention or frequency.  No nocturia. Musculoskeletal:  No neck pain, back pain Integumentary: No rash, pruritus, skin lesions Neurological: as above Psychiatric: No depression at this time.  No anxiety Endocrine: No palpitations, diaphoresis, change in appetite, change in weigh or increased thirst Hematologic/Lymphatic:  No  anemia, purpura, petechiae. Allergic/Immunologic: No itchy/runny eyes, nasal congestion, recent allergic reactions, rashes  ALLERGIES: Allergies  Allergen Reactions   Honey Diarrhea    HOME MEDICATIONS:  Current Outpatient Medications:    amLODipine (NORVASC) 10 MG tablet, Take by mouth., Disp: , Rfl:    ibuprofen (ADVIL) 800 MG tablet, Take 1 tablet (800 mg total) by mouth 3 (three) times daily., Disp: 21 tablet, Rfl: 0   levonorgestrel (LILETTA, 52 MG,) 18.6 MCG/DAY IUD IUD, 1 each by Intrauterine route once., Disp: , Rfl:     oxyCODONE-acetaminophen (PERCOCET/ROXICET) 5-325 MG tablet, Take 1 tablet by mouth every 8 (eight) hours as needed for severe pain., Disp: 6 tablet, Rfl: 0   naproxen (NAPROSYN) 500 MG tablet, Take 1 tablet (500 mg total) by mouth 2 (two) times daily with a meal. 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   Counseled:____6/30   Suppress @ 34wks:_____    Medical history non-contributory    Multiple sclerosis ([place])    Pregnancy    Smoker 08/10/2013    PAST SURGICAL HISTORY: Past Surgical History:  Procedure Laterality Date   CESAREAN SECTION     CESAREAN SECTION N/A 02/25/2014   Procedure: REPEAT CESAREAN SECTION;  Surgeon: Jonnie Kind, MD;  Location: [facility];  Service: Obstetrics;  Laterality: N/A;   TONGUE SURGERY     due to accident as child    FAMILY HISTORY: Family History  Problem Relation Age of Onset   Hypertension Mother    Heart Problems Mother    Stroke Father    Heart Problems Father    Multiple sclerosis Father    Seizures Father    Hypertension Maternal Grandfather    Kidney disease Maternal Grandfather     SOCIAL HISTORY: Social History   Socioeconomic History   Marital status: Single    Spouse name: Not on file   Number of children: Not on file   Years of education: Not on file   Highest education level: Not on file  Occupational History   Not on file  Tobacco Use   Smoking status: Some Days    Packs/day: 0.50    Types: Cigarettes   Smokeless tobacco: Never  Vaping Use   Vaping Use: Never used  Substance and Sexual Activity   Alcohol use: Yes    Comment: occ.    Drug use: Yes    Frequency: 7.0 times per week    Types: Marijuana   Sexual activity: Yes    Birth control/protection: None  Other Topics Concern   Not on file  Social History Narrative   Not on file   Social Determinants of Health   Financial Resource  Strain: Not on file  Food Insecurity: Not on file  Transportation Needs: Not on file  Physical Activity: Not on file  Stress: Not on file  Social Connections: Not on file  Intimate Partner Violence: Not on file       PHYSICAL EXAM  Vitals:   04/17/22 0842  BP: (!) 154/108  Pulse: 97  Weight: 254 lb 8 oz (115.4 kg)  Height: 5\' 2"  (1.575 m)    Body mass index is 46.55 kg/m.  Vision Screening   Right eye Left eye Both eyes  Without correction 20/30 20/40 20/30   With correction        General: The patient is well-developed and well-nourished and in no acute distress.  Due to broken left knee she has a brace  HEENT:  Head is [place]/AT.  Sclera are anicteric.  Funduscopic exam shows normal optic discs and retinal vessels.  Neck: No carotid bruits are noted.  The neck is nontender.  Cardiovascular: The heart has a regular rate and rhythm with a normal S1 and S2. There were no murmurs, gallops or rubs.    Skin: Extremities are without rash or  edema.  Musculoskeletal:  Back is nontender  Neurologic Exam  Mental status: The patient is alert and oriented x 3 at the time of the examination. The patient has apparent normal recent and remote memory, with an apparently normal attention span and concentration ability.   Speech is normal.  Cranial nerves: Extraocular movements are full. Pupils are equal, round, and reactive to light and accomodation.  Visual fields are full.  Facial symmetry is present. There is good facial sensation to soft touch bilaterally.Facial strength is normal.  Trapezius and sternocleidomastoid strength is normal. No dysarthria is noted.  The tongue is midline, and the patient has symmetric elevation of the soft palate. No obvious hearing deficits are noted.  Motor:  Muscle bulk is normal.   She has moderately increased tone of the left leg and minimally be increased tone in the right leg.  Strength was 4+/5 in the iliopsoas on the right and 5/5 elsewhere.   Strength was 3/5 in the iliopsoas, 4/5 in the quads and hamstrings and 4 -/5 at the ankle and toes on the left.  Sensory: She reports reduced sensation to vibration in the left side relative to the right side.  Touch sensation was slightly asymmetric as well.  She appeared to have worse sensation in the legs than the arms.  There may have been additionally reduction in sensation in the left lateral femoral cutaneous nerve.  Coordination: Cerebellar testing reveals good finger-nose-finger.  Due to her broken knee I could not test heel-to-shin..  Gait and station: Gait is reduced due to broken knee and she needs support to stand..   Reflexes: Deep tendon reflexes are symmetric and normal bilaterally.   Plantar responses are flexor.    DIAGNOSTIC DATA (LABS, IMAGING, TESTING) - I reviewed patient records, labs, notes, testing and imaging myself where available.  Lab Results  Component Value Date   WBC 5.9 06/02/2017   HGB 13.0 06/02/2017   HCT 40.1 06/02/2017   MCV 99.0 06/02/2017   PLT 252 06/02/2017      Component Value Date/Time   NA 138 06/02/2017 2315   K 3.7 06/02/2017 2315   CL 108 06/02/2017 2315   CO2 26 06/02/2017 2315   GLUCOSE 81 06/02/2017 2315   BUN 13 06/02/2017 2315   CREATININE 0.77 06/02/2017 2315   CALCIUM 8.7 (L) 06/02/2017 2315   PROT 7.1 06/02/2017 2315   ALBUMIN 3.7 06/02/2017 2315   AST 20 06/02/2017 2315   ALT 17 06/02/2017 2315   ALKPHOS 42 06/02/2017 2315   BILITOT 0.4 06/02/2017 2315   GFRNONAA >60 06/02/2017 2315   GFRAA >60 06/02/2017 2315    Lab Results  Component Value Date   [id number] 06/03/2017   Lab Results  Component Value Date   TSH 0.561 06/03/2017       ASSESSMENT AND PLAN  Multiple sclerosis ([place]) - Plan: MR BRAIN W WO CONTRAST, MR CERVICAL SPINE W WO CONTRAST, IgG, IgA, IgM, Hepatitis B surface antigen, HIV Antibody (routine testing w rflx), QuantiFERON-TB Gold Plus, Stratify JCV Antibody Test ([facility]), Hepatitis B  surface antibody,qualitative, Hepatitis C antibody, Hepatitis B core antibody, total, CBC with Differential/Platelet, Comprehensive metabolic panel, Varicella zoster antibody, IgG  High risk medication use - Plan: IgG, IgA, IgM, Hepatitis B surface antigen, HIV Antibody (routine testing w rflx), QuantiFERON-TB Gold Plus, Stratify JCV Antibody Test ([facility]), Hepatitis B surface antibody,qualitative, Hepatitis C antibody, Hepatitis B core antibody, total, CBC with Differential/Platelet, Comprehensive metabolic panel, Varicella zoster antibody, IgG  Gait disturbance - Plan: MR BRAIN W WO CONTRAST, MR CERVICAL SPINE W WO CONTRAST  Numbness - Plan: MR BRAIN W WO CONTRAST, MR CERVICAL SPINE W WO CONTRAST   In summary, Ms. Issa is a 36 year old woman who was diagnosed with MS in 2018 but was only on a disease modifying therapy for a few months.  Over the past few years she has had more difficulties with her gait and was experiencing frequent stumbles with occasional falls.  Recently she broke her left knee due to a fall.  She needs to get on a disease modifying therapy.  She presented with a spinal cord syndrome and has had more symptoms referable to the spinal cord.  Therefore, she appears to have a more aggressive than typical MS and she needs to go on a highly efficacious disease modifying therapy.  I discussed Tysabri and Ocrevus/Briumvi with her.  We will check lab work to make sure that she is a candidate for 1 or both of these medications and then try to get her initial infusion within a few weeks.  We will also check MRI of the brain and cervical spine to determine how aggressive her breakthrough activity has been which will also help [place] select the medication.  She might also have carpal tunnel syndrome on the right and we will consider investigating this further once we get her started on the medication.  She will follow-up with me for regular visit in 6 months but call sooner for new or worsening  neurologic symptoms.  Thank you for asking me to see Mrs. Rockey Situ.  Please let me know if I can be of further assistance with her or other patients in the future.  Zendayah Hardgrave A. Felecia Shelling, MD, [facility] [id number], [id number] AM Certified in Neurology, Clinical Neurophysiology, Sleep Medicine and Neuroimaging  [facility] [address] [place], [place] [address] [phone]

## 2022-04-17 ENCOUNTER — Encounter: Payer: Self-pay | Admitting: Neurology

## 2022-04-17 ENCOUNTER — Ambulatory Visit: Payer: Medicaid Other | Admitting: Neurology

## 2022-04-17 VITALS — BP 154/108 | HR 97 | Ht 62.0 in | Wt 254.5 lb

## 2022-04-17 DIAGNOSIS — R200 Anesthesia of skin: Secondary | ICD-10-CM

## 2022-04-17 DIAGNOSIS — Z79899 Other long term (current) drug therapy: Secondary | ICD-10-CM

## 2022-04-17 DIAGNOSIS — G35 Multiple sclerosis: Secondary | ICD-10-CM | POA: Diagnosis not present

## 2022-04-17 DIAGNOSIS — R269 Unspecified abnormalities of gait and mobility: Secondary | ICD-10-CM | POA: Diagnosis not present

## 2022-04-18 ENCOUNTER — Ambulatory Visit
Admission: RE | Admit: 2022-04-18 | Discharge: 2022-04-18 | Disposition: A | Discharge status: Home / Self Care | Payer: Medicaid Other | Source: Ambulatory Visit | Attending: Orthopedic Surgery | Admitting: Orthopedic Surgery

## 2022-04-18 DIAGNOSIS — S83412A Sprain of medial collateral ligament of left knee, initial encounter: Secondary | ICD-10-CM

## 2022-04-18 DIAGNOSIS — S82122A Displaced fracture of lateral condyle of left tibia, initial encounter for closed fracture: Secondary | ICD-10-CM

## 2022-04-19 ENCOUNTER — Telehealth: Payer: Self-pay | Admitting: Neurology

## 2022-04-19 NOTE — Telephone Encounter (Signed)
MR brain [facility] Josem Kaufmann: [id number] exp. 04/19/22-06/03/22  MR cervical [employer] medicaid auth:  [id number] exp. 04/19/22-06/03/22  sent to GI [phone]

## 2022-04-21 LAB — CBC WITH DIFFERENTIAL/PLATELET
Basophils Absolute: 0 10*3/uL (ref 0.0–0.2)
Basos: 0 %
EOS (ABSOLUTE): 0.1 10*3/uL (ref 0.0–0.4)
Eos: 2 %
Hematocrit: 42.7 % (ref 34.0–46.6)
Hemoglobin: 14.7 g/dL (ref 11.1–15.9)
Immature Grans (Abs): 0 10*3/uL (ref 0.0–0.1)
Immature Granulocytes: 0 %
Lymphocytes Absolute: 1.5 10*3/uL (ref 0.7–3.1)
Lymphs: 31 %
MCH: 33.1 pg — ABNORMAL HIGH (ref 26.6–33.0)
MCHC: 34.4 g/dL (ref 31.5–35.7)
MCV: 96 fL (ref 79–97)
Monocytes Absolute: 0.4 10*3/uL (ref 0.1–0.9)
Monocytes: 9 %
Neutrophils Absolute: 2.7 10*3/uL (ref 1.4–7.0)
Neutrophils: 58 %
Platelets: 263 10*3/uL (ref 150–450)
RBC: 4.44 x10E6/uL (ref 3.77–5.28)
RDW: 11.6 % — ABNORMAL LOW (ref 11.7–15.4)
WBC: 4.7 10*3/uL (ref 3.4–10.8)

## 2022-04-21 LAB — HEPATITIS B SURFACE ANTIGEN: Hepatitis B Surface Ag: NEGATIVE

## 2022-04-21 LAB — HEPATITIS B CORE ANTIBODY, TOTAL: Hep B Core Total Ab: NEGATIVE

## 2022-04-21 LAB — COMPREHENSIVE METABOLIC PANEL
ALT: 13 IU/L (ref 0–32)
AST: 15 IU/L (ref 0–40)
Albumin/Globulin Ratio: 1.4 (ref 1.2–2.2)
Albumin: 4.2 g/dL (ref 3.9–4.9)
Alkaline Phosphatase: 64 IU/L (ref 44–121)
BUN/Creatinine Ratio: 11 (ref 9–23)
BUN: 8 mg/dL (ref 6–20)
Bilirubin Total: 0.4 mg/dL (ref 0.0–1.2)
CO2: 22 mmol/L (ref 20–29)
Calcium: 9.3 mg/dL (ref 8.7–10.2)
Chloride: 103 mmol/L (ref 96–106)
Creatinine, Ser: 0.71 mg/dL (ref 0.57–1.00)
Globulin, Total: 3.1 g/dL (ref 1.5–4.5)
Glucose: 75 mg/dL (ref 70–99)
Potassium: 4.1 mmol/L (ref 3.5–5.2)
Sodium: 140 mmol/L (ref 134–144)
Total Protein: 7.3 g/dL (ref 6.0–8.5)
eGFR: 114 mL/min/{1.73_m2} (ref >59)

## 2022-04-21 LAB — QUANTIFERON-TB GOLD PLUS
QuantiFERON Mitogen Value: >10 IU/mL
QuantiFERON Nil Value: 0.04 IU/mL
QuantiFERON TB1 Ag Value: 0.04 IU/mL
QuantiFERON TB2 Ag Value: 0.2 IU/mL
QuantiFERON-TB Gold Plus: NEGATIVE

## 2022-04-21 LAB — IGG, IGA, IGM
IgA/Immunoglobulin A, Serum: 539 mg/dL — ABNORMAL HIGH (ref 87–352)
IgG (Immunoglobin G), Serum: 1194 mg/dL (ref 586–1602)
IgM (Immunoglobulin M), Srm: 74 mg/dL (ref 26–217)

## 2022-04-21 LAB — VARICELLA ZOSTER ANTIBODY, IGG: Varicella zoster IgG: 3611 index (ref >165)

## 2022-04-21 LAB — HIV ANTIBODY (ROUTINE TESTING W REFLEX): HIV Screen 4th Generation wRfx: NONREACTIVE

## 2022-04-21 LAB — HEPATITIS C ANTIBODY: Hep C Virus Ab: NONREACTIVE

## 2022-04-21 LAB — HEPATITIS B SURFACE ANTIBODY,QUALITATIVE: Hep B Surface Ab, Qual: REACTIVE

## 2022-05-11 ENCOUNTER — Ambulatory Visit
Admission: RE | Admit: 2022-05-11 | Discharge: 2022-05-11 | Disposition: A | Discharge status: Home / Self Care | Payer: Medicaid Other | Source: Ambulatory Visit | Attending: Neurology | Admitting: Neurology

## 2022-05-11 DIAGNOSIS — R269 Unspecified abnormalities of gait and mobility: Secondary | ICD-10-CM | POA: Diagnosis not present

## 2022-05-11 DIAGNOSIS — R200 Anesthesia of skin: Secondary | ICD-10-CM

## 2022-05-11 DIAGNOSIS — G35 Multiple sclerosis: Secondary | ICD-10-CM | POA: Diagnosis not present

## 2022-05-11 MED ORDER — GADOPICLENOL 0.5 MMOL/ML IV SOLN
10.0000 mL | Freq: Once | INTRAVENOUS | Status: AC | PRN
  Administered 2022-05-11: 10 mL via INTRAVENOUS

## 2022-05-15 ENCOUNTER — Telehealth: Payer: Self-pay | Admitting: Neurology

## 2022-05-15 ENCOUNTER — Other Ambulatory Visit (INDEPENDENT_AMBULATORY_CARE_PROVIDER_SITE_OTHER): Payer: Self-pay

## 2022-05-15 DIAGNOSIS — Z0289 Encounter for other administrative examinations: Secondary | ICD-10-CM

## 2022-05-15 NOTE — Telephone Encounter (Signed)
Placed JCV lab in [facility] lock box for routine lab pick up. Results pending. 

## 2022-05-27 NOTE — Telephone Encounter (Signed)
JCV ab drawn 05/15/2022 positive, index: 0.66. Gave to MD for review.

## 2022-05-28 ENCOUNTER — Telehealth: Payer: Self-pay

## 2022-05-28 NOTE — Telephone Encounter (Signed)
-----   Message from Asa Lente, MD sent at 05/27/2022  6:56 PM EST ----- Please let her know that the test came back fine for her to begin one of the CD20 agents.  If she thinks she can do self injections we could do Kesimpta.  Otherwise we can set her up for Ocrevus but may need to do either specialty pharmacy or go through [facility] infusion.

## 2022-05-28 NOTE — Telephone Encounter (Signed)
I called patient to discuss.  No answer, left a voicemail asking her to call us back. 

## 2022-06-03 NOTE — Telephone Encounter (Signed)
I called patient to discuss. No answer, left a message asking her to call me back. ?

## 2022-06-11 NOTE — Telephone Encounter (Signed)
I called patient.  She would like to start Kesimpta.  I discussed common side effects.  I will send her the start form via MyChart.  She was on the start form and I will fax it back to alongside Kesimpta was received.  Patient's insurance is [place] Medicaid and therefore does not require a PA.  Patient verbalized understanding.

## 2022-06-19 ENCOUNTER — Telehealth: Payer: Self-pay | Admitting: *Deleted

## 2022-06-19 DIAGNOSIS — G35 Multiple sclerosis: Secondary | ICD-10-CM

## 2022-06-19 NOTE — Telephone Encounter (Signed)
Faxed completed/signed Kesimpta start form to [employer] at [phone]. Received fax confirmation.

## 2022-06-24 MED ORDER — KESIMPTA 20 MG/0.4ML ~~LOC~~ SOAJ: 1.2000 mL | SUBCUTANEOUS | 11 refills | Status: DC

## 2022-06-24 MED ORDER — KESIMPTA 20 MG/0.4ML ~~LOC~~ SOAJ: 0.4000 mL | SUBCUTANEOUS | 0 refills | Status: DC

## 2022-06-24 NOTE — Telephone Encounter (Signed)
PA Kesimpta submitted on covermymeds. Key: [id number]. Waiting on determination from [employer].

## 2022-06-24 NOTE — Telephone Encounter (Signed)
Received notification from [facility] community plan that no PA needed, only requires a cost override due to the filled amount exceeding a present cost limit. The override has been authorized until 07/14/2022. Ref# [id number].  E-scribed loading/maintenance dose to [facility] specialty pharmacy.

## 2022-06-24 NOTE — Addendum Note (Signed)
Addended by: Arther Abbott on: 06/24/2022 05:07 PM   Modules accepted: Orders

## 2022-06-25 ENCOUNTER — Other Ambulatory Visit: Payer: Self-pay | Admitting: *Deleted

## 2022-06-25 DIAGNOSIS — G35 Multiple sclerosis: Secondary | ICD-10-CM

## 2022-06-25 MED ORDER — KESIMPTA 20 MG/0.4ML ~~LOC~~ SOAJ: 0.4000 mL | SUBCUTANEOUS | 11 refills | Status: DC

## 2022-07-10 ENCOUNTER — Encounter: Payer: Self-pay | Admitting: Neurology

## 2022-09-27 ENCOUNTER — Other Ambulatory Visit: Payer: Self-pay | Admitting: Neurology

## 2022-09-27 DIAGNOSIS — G35 Multiple sclerosis: Secondary | ICD-10-CM

## 2022-10-03 ENCOUNTER — Other Ambulatory Visit: Payer: Self-pay | Admitting: Neurology

## 2022-10-03 DIAGNOSIS — G35 Multiple sclerosis: Secondary | ICD-10-CM

## 2022-10-03 MED ORDER — KESIMPTA 20 MG/0.4ML ~~LOC~~ SOAJ: 0.4000 mL | SUBCUTANEOUS | 0 refills | Status: DC

## 2022-10-03 NOTE — Telephone Encounter (Signed)
Pt called wanting to know when this will be filled. Please advise.

## 2022-10-09 ENCOUNTER — Telehealth: Payer: Self-pay | Admitting: Neurology

## 2022-10-09 NOTE — Telephone Encounter (Signed)
Garrette called from [facility]. Stated he needs to clarify prescription  KESIMPTA 20 MG/0.4ML SOAJ.

## 2022-10-09 NOTE — Telephone Encounter (Signed)
[facility] specialty pharmacy back and spoke w/ Anne Ng. Transferred me to pharmacist, Diatra. Appears Dr. Brett Fairy e-scribed Kesimpta refill 10/03/22 but it included starter dose instructions. Pt on maintenance dose of 0.40ml SQ once a month now. Does not need starter dose. I relayed this. She will cx rx refill sent on 10/03/22.   They have 06/26/23 rx for kesimpta 0.87ml once a month on file. Qty 0.84ml, 11 refills. They will use this prescription to refill med.  They will call pt to get delivery set up, nothing further needed.

## 2022-10-24 ENCOUNTER — Encounter: Payer: Self-pay | Admitting: Neurology

## 2022-10-24 ENCOUNTER — Ambulatory Visit: Payer: Medicaid Other | Admitting: Neurology

## 2022-10-29 ENCOUNTER — Encounter: Payer: Self-pay | Admitting: Neurology

## 2022-10-29 ENCOUNTER — Ambulatory Visit (INDEPENDENT_AMBULATORY_CARE_PROVIDER_SITE_OTHER): Payer: Medicaid Other | Admitting: Neurology

## 2022-10-29 VITALS — BP 114/78 | HR 58 | Ht 64.0 in | Wt 248.5 lb

## 2022-10-29 DIAGNOSIS — Z79899 Other long term (current) drug therapy: Secondary | ICD-10-CM | POA: Insufficient documentation

## 2022-10-29 DIAGNOSIS — R269 Unspecified abnormalities of gait and mobility: Secondary | ICD-10-CM | POA: Diagnosis not present

## 2022-10-29 DIAGNOSIS — G35 Multiple sclerosis: Secondary | ICD-10-CM | POA: Insufficient documentation

## 2022-10-29 DIAGNOSIS — R3915 Urgency of urination: Secondary | ICD-10-CM | POA: Insufficient documentation

## 2022-10-29 DIAGNOSIS — R200 Anesthesia of skin: Secondary | ICD-10-CM | POA: Diagnosis not present

## 2022-10-29 MED ORDER — OXYBUTYNIN CHLORIDE ER 10 MG PO TB24: 10.0000 mg | ORAL_TABLET | Freq: Every day | ORAL | 11 refills | Status: DC

## 2022-10-29 NOTE — Progress Notes (Signed)
[facility]  PATIENT: Sherri Woodward DOB: 10/26/1985  REFERRING DOCTOR OR PCP: Romie Jumper, PA-C, SOURCE: Patient, notes from primary care, hospital notes from 2018, imaging and lab reports, MRI images personally reviewed.  _________________________________   HISTORICAL  CHIEF COMPLAINT:  Chief Complaint  Patient presents with   Follow-up    Pt in room 10 for MS follow up. Pt said left knee is weak, reports last year she broke her knee because of fall. Pt said she noticed more when working (does a lot of walking and moving)    HISTORY OF PRESENT ILLNESS:   Sherri Wood[facility], is a 37 y.o. woman with multiple sclerosis.  Update 10/29/2022 She started Kesimpta 06/2022 and is tolerating it well.    She feels neurologically stable and has no new exacerbation.       She feels her balance is off and the left knee often gives out on her.  The left leg is mildly weaker than the right leg.  She can go up stairs well but needs banister going down.    She could easily walk a mile.  The right arm sometimes goes numb on her.     She has urinary urgency/frequency.   She has nocturia 1-6 times.     Vision is doing well.  She falls asleep quickly but due to nocturia is up several times a night and sometimes has trouble falling back asleep.   She denies excessive fatigue.   She has no cognitive issues.  Mood is doing well.    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      MS HISTORY She presented to [facility] 06/02/2017 at the age of 32 after the onset of paresthesias in her trunk and legs.  Symptoms developed over a couple days.   Additionally she had difficulty with weakness, right greater than left and had difficulty climbing stairs.  She had MRI of the brain and cervical spine that were consistent with multiple sclerosis.  She received several days of high-dose Solu-Medrol with some improvement of  symptoms.   She saw Dr. Gerilyn Pilgrim and was placed on Tecfidera..   She was only on the medication for a few months.   She had stomach upset.   She stopped after a few months whrn her prescription ran out and has not seen neurology or been on a DMT since 2019.    I saw her for the first time in late 2023 and she started [facility] December 2023.     Her father had MS and became bedridden / wheelchair  Imaging: MRI of the brain 06/03/2017 showed scattered T2/FLAIR hyperintense foci in the periventricular white matter.  No infratentorial lesions in the brain  MRI of the cervical and thoracic spine 06/03/2017 shows T2 hyperintense foci in the posterior spinal cord adjacent to C2 and adjacent to C3-C4.  MRI of the brain 05/11/2022 shows Multiple T2/FLAIR hyperintense foci predominantly in the periventricular white matter of the cerebral hemispheres and at least 2 foci in the cervical spinal cord in a pattern consistent with chronic demyelinating plaque associated with multiple sclerosis. None of the foci enhanced or appear to be acute. However, 1 focus in the right parietal periventricular white matter was not present on the 06/03/2017 MRI.   MRI of the cervical spine showed There are multiple T2 hyperintense foci within the spinal cord. These are located centrally / posteriorly  just below the cervicomedullary junction and adjacent to C1 and C2, posteriorly and towards the left adjacent to C3-C4 and towards the left adjacent to C4-C5.   REVIEW OF SYSTEMS: Constitutional: No fevers, chills, sweats, or change in appetite Eyes: No visual changes, double vision, eye pain Ear, nose and throat: No hearing loss, ear pain, nasal congestion, sore throat Cardiovascular: No chest pain, palpitations Respiratory:  No shortness of breath at rest or with exertion.   No wheezes GastrointestinaI: No nausea, vomiting, diarrhea, abdominal pain, fecal incontinence Genitourinary:  No dysuria, urinary retention or frequency.   No nocturia. Musculoskeletal:  No neck pain, back pain Integumentary: No rash, pruritus, skin lesions Neurological: as above Psychiatric: No depression at this time.  No anxiety Endocrine: No palpitations, diaphoresis, change in appetite, change in weigh or increased thirst Hematologic/Lymphatic:  No anemia, purpura, petechiae. Allergic/Immunologic: No itchy/runny eyes, nasal congestion, recent allergic reactions, rashes  ALLERGIES: Allergies  Allergen Reactions   Honey Diarrhea    HOME MEDICATIONS:  Current Outpatient Medications:    amLODipine (NORVASC) 10 MG tablet, Take by mouth., Disp: , Rfl:    KESIMPTA 20 MG/0.4ML SOAJ, Inject 0.4 mLs into the skin every 30 (thirty) days. Start this at week 4 after finishing loading dose prescription., Disp: 0.4 mL, Rfl: 11   levonorgestrel (LILETTA, 52 MG,) 18.6 MCG/DAY IUD IUD, 1 each by Intrauterine route once., Disp: , Rfl:    oxybutynin (DITROPAN XL) 10 MG 24 hr tablet, Take 1 tablet (10 mg total) by mouth at bedtime., Disp: 30 tablet, Rfl: 11   ibuprofen (ADVIL) 800 MG tablet, Take 1 tablet (800 mg total) by mouth 3 (three) times daily., Disp: 21 tablet, Rfl: 0   naproxen (NAPROSYN) 500 MG tablet, Take 1 tablet (500 mg total) by mouth 2 (two) times daily with a meal. (Patient not taking: Reported on 06/03/2017), Disp: 60 tablet, Rfl: 5   oxyCODONE-acetaminophen (PERCOCET/ROXICET) 5-325 MG tablet, Take 1 tablet by mouth every 8 (eight) hours as needed for severe pain. 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   Counseled:____6/30   Suppress @ 34wks:_____    Medical history non-contributory    Multiple sclerosis    Pregnancy    Smoker 08/10/2013    PAST SURGICAL HISTORY: Past Surgical History:  Procedure Laterality Date   CESAREAN SECTION     CESAREAN SECTION N/A 02/25/2014   Procedure: REPEAT CESAREAN SECTION;   Surgeon: [name], MD;  Location: [facility];  Service: Obstetrics;  Laterality: N/A;   TONGUE SURGERY     due to accident as child    FAMILY HISTORY: Family History  Problem Relation Age of Onset   Hypertension Mother    Heart Problems Mother    Stroke Father    Heart Problems Father    Multiple sclerosis Father    Seizures Father    Hypertension Maternal Grandfather    Kidney disease Maternal Grandfather     SOCIAL HISTORY: Social History   Socioeconomic History   Marital status: Single    Spouse name: Not on file   Number of children: Not on file   Years of education: Not on file   Highest education level: Not on file  Occupational History   Not on file  Tobacco Use   Smoking status: Some Days    Packs/day: .5    Types: Cigarettes   Smokeless tobacco: Never  Vaping  Use   Vaping Use: Never used  Substance and Sexual Activity   Alcohol use: Yes    Comment: occ.    Drug use: Yes    Frequency: 7.0 times per week    Types: Marijuana   Sexual activity: Yes    Birth control/protection: None  Other Topics Concern   Not on file  Social History Narrative   Not on file   Social Determinants of Health   Financial Resource Strain: Not on file  Food Insecurity: Not on file  Transportation Needs: Not on file  Physical Activity: Not on file  Stress: Not on file  Social Connections: Not on file  Intimate Partner Violence: Not on file       PHYSICAL EXAM  Vitals:   10/29/22 0858  BP: 114/78  Pulse: (!) 58  Weight: 248 lb 8 oz (112.7 kg)  Height: 5\' 4"  (1.626 m)    Body mass index is 42.65 kg/m.  No results found.    General: The patient is well-developed and well-nourished and in no acute distress.    HEENT:  Head is [place]/AT.  Sclera are anicteric.    Skin: Extremities are without rash or  edema.  Neurologic Exam  Mental status: The patient is alert and oriented x 3 at the time of the examination. The patient has apparent normal recent and  remote memory, with an apparently normal attention span and concentration ability.   Speech is normal.  Cranial nerves: Extraocular movements are full.  Strength and sensation was normal.  No dysarthria.   No obvious hearing deficits are noted.  Motor:  Muscle bulk is normal.   She has moderately increased tone of the left leg and minimally be increased tone in the right leg.  Strength was 4+/5 in the iliopsoas on the right and 5/5 elsewhere.  Strength was 4+/5 in the left leg  Sensory: She reports reduced sensation to vibration in the left side relative to the right side.  Touch sensation was slightly asymmetric as well, lower on right.    Coordination: Cerebellar testing reveals good finger-nose-finger and slightly off  heel-to-shin..  Gait and station:   Station was normal.  Gait is mildly wide and tandem gait is wide.  Romberg is negative.  Reflexes: Deep tendon reflexes are symmetric and normal bilaterally.      DIAGNOSTIC DATA (LABS, IMAGING, TESTING) - I reviewed patient records, labs, notes, testing and imaging myself where available.  Lab Results  Component Value Date   WBC 4.7 04/17/2022   HGB 14.7 04/17/2022   HCT 42.7 04/17/2022   MCV 96 04/17/2022   PLT 263 04/17/2022      Component Value Date/Time   NA 140 04/17/2022 0956   K 4.1 04/17/2022 0956   CL 103 04/17/2022 0956   CO2 22 04/17/2022 0956   GLUCOSE 75 04/17/2022 0956   GLUCOSE 81 06/02/2017 2315   BUN 8 04/17/2022 0956   CREATININE 0.71 04/17/2022 0956   CALCIUM 9.3 04/17/2022 0956   PROT 7.3 04/17/2022 0956   ALBUMIN 4.2 04/17/2022 0956   AST 15 04/17/2022 0956   ALT 13 04/17/2022 0956   ALKPHOS 64 04/17/2022 0956   BILITOT 0.4 04/17/2022 0956   GFRNONAA >60 06/02/2017 2315   GFRAA >60 06/02/2017 2315    Lab Results  Component Value Date   VITAMINB12 580 06/03/2017   Lab Results  Component Value Date   TSH 0.561 06/03/2017       ASSESSMENT AND PLAN  Multiple  sclerosis  High risk  medication use  Gait disturbance  Numbness  Urinary urgency   Continue Kesimpta.  Since she has only been on a couple months I will hold off on blood work and check at the next visit (CBC/differential, LFT, CD20, IgG/IgM). Oxybutynin for bladder. Stay active and exercise as tolerated.  We discussed trying to lose weight. Return in 6 months or sooner if there are new or worsening neurologic symptoms.    Sharvi Mooneyhan A. Epimenio Foot, MD, [facility] 10/29/2022, 9:53 AM Certified in Neurology, Clinical Neurophysiology, Sleep Medicine and Neuroimaging  [facility] [address], [address] [place], [place] [address] [phone]

## 2023-04-07 ENCOUNTER — Ambulatory Visit: Payer: Medicaid Other | Admitting: Neurology

## 2023-04-07 ENCOUNTER — Encounter: Payer: Self-pay | Admitting: Neurology

## 2023-05-07 ENCOUNTER — Ambulatory Visit: Payer: Medicaid Other | Admitting: Neurology

## 2023-06-30 ENCOUNTER — Encounter: Payer: Self-pay | Admitting: Neurology

## 2023-06-30 ENCOUNTER — Ambulatory Visit (INDEPENDENT_AMBULATORY_CARE_PROVIDER_SITE_OTHER): Payer: Medicaid Other | Admitting: Neurology

## 2023-06-30 VITALS — BP 138/99 | HR 71 | Ht 64.0 in | Wt 239.0 lb

## 2023-06-30 DIAGNOSIS — R200 Anesthesia of skin: Secondary | ICD-10-CM

## 2023-06-30 DIAGNOSIS — R269 Unspecified abnormalities of gait and mobility: Secondary | ICD-10-CM | POA: Diagnosis not present

## 2023-06-30 DIAGNOSIS — Z79899 Other long term (current) drug therapy: Secondary | ICD-10-CM | POA: Diagnosis not present

## 2023-06-30 DIAGNOSIS — G35 Multiple sclerosis: Secondary | ICD-10-CM

## 2023-06-30 DIAGNOSIS — R3915 Urgency of urination: Secondary | ICD-10-CM

## 2023-06-30 NOTE — Progress Notes (Signed)
[facility]  PATIENT: Sherri Woodward DOB: 06-27-1986  REFERRING DOCTOR OR PCP: Romie Jumper, PA-C, SOURCE: Patient, notes from primary care, hospital notes from 2018, imaging and lab reports, MRI images personally reviewed.  _________________________________   HISTORICAL  CHIEF COMPLAINT:  Chief Complaint  Patient presents with   Room 11    Pt is here Alone. Pt states that she still has numbness in her legs. Pt states that her right arm hurts, pt states that she can't get comfortable when she lays down. Pt denies any other questions or concerns. Pt is needing a work note.     HISTORY OF PRESENT ILLNESS:   Sherri Woodward, is a 37 y.o. woman with multiple sclerosis.  Update 12/16//2024 She started Kesimpta 06/2022 and is tolerating it well.    She feels neurologically stable and has no new exacerbation.       Gait slows down if she goes on a longer walk.  She feels her balance is off and the left knee often gives out on her.  .  She can go up stairs well but needs banister going down.   The left leg is mildly weaker than the right leg   The right arm is mildly weak and sometimes goes numb on her and sometimes hurts.     She has urinary urgency/frequency.   She has less nocturia on oxybutynin    Vision is doing well.  She falls asleep quickly.     She notes some fatigue.   She has no cognitive issues.  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      MS HISTORY She presented to [facility] 06/02/2017 at the age of 58 after the onset of paresthesias in her trunk and legs.  Symptoms developed over a couple days.   Additionally she had difficulty with weakness, right greater than left and had difficulty climbing stairs.  She had MRI of the brain and cervical spine that were consistent with multiple sclerosis.  She received several days of high-dose Solu-Medrol with  some improvement of symptoms.   She saw Dr. Gerilyn Pilgrim and was placed on Tecfidera..   She was only on the medication for a few months.   She had stomach upset.   She stopped after a few months whrn her prescription ran out and has not seen neurology or been on a DMT since 2019.    I saw her for the first time in late 2023 and she started [facility] December 2023.     Her father had MS and became bedridden / wheelchair  Imaging: MRI of the brain 06/03/2017 showed scattered T2/FLAIR hyperintense foci in the periventricular white matter.  No infratentorial lesions in the brain  MRI of the cervical and thoracic spine 06/03/2017 shows T2 hyperintense foci in the posterior spinal cord adjacent to C2 and adjacent to C3-C4.  MRI of the brain 05/11/2022 shows Multiple T2/FLAIR hyperintense foci predominantly in the periventricular white matter of the cerebral hemispheres and at least 2 foci in the cervical spinal cord in a pattern consistent with chronic demyelinating plaque associated with multiple sclerosis. None of the foci enhanced or appear to be acute. However, 1 focus in the right parietal periventricular white matter was not present on the 06/03/2017 MRI.   MRI of the cervical spine showed There are multiple T2 hyperintense foci within the spinal cord.  These are located centrally / posteriorly just below the cervicomedullary junction and adjacent to C1 and C2, posteriorly and towards the left adjacent to C3-C4 and towards the left adjacent to C4-C5.   REVIEW OF SYSTEMS: Constitutional: No fevers, chills, sweats, or change in appetite Eyes: No visual changes, double vision, eye pain Ear, nose and throat: No hearing loss, ear pain, nasal congestion, sore throat Cardiovascular: No chest pain, palpitations Respiratory:  No shortness of breath at rest or with exertion.   No wheezes GastrointestinaI: No nausea, vomiting, diarrhea, abdominal pain, fecal incontinence Genitourinary:  No dysuria, urinary  retention or frequency.  No nocturia. Musculoskeletal:  No neck pain, back pain Integumentary: No rash, pruritus, skin lesions Neurological: as above Psychiatric: No depression at this time.  No anxiety Endocrine: No palpitations, diaphoresis, change in appetite, change in weigh or increased thirst Hematologic/Lymphatic:  No anemia, purpura, petechiae. Allergic/Immunologic: No itchy/runny eyes, nasal congestion, recent allergic reactions, rashes  ALLERGIES: Allergies  Allergen Reactions   Honey Diarrhea    HOME MEDICATIONS:  Current Outpatient Medications:    amLODipine (NORVASC) 10 MG tablet, Take by mouth., Disp: , Rfl:    KESIMPTA 20 MG/0.4ML SOAJ, Inject 0.4 mLs into the skin every 30 (thirty) days. Start this at week 4 after finishing loading dose prescription., Disp: 0.4 mL, Rfl: 11   levonorgestrel (LILETTA, 52 MG,) 18.6 MCG/DAY IUD IUD, 1 each by Intrauterine route once. (Patient not taking: Reported on 06/30/2023), Disp: , Rfl:    naproxen (NAPROSYN) 500 MG tablet, Take 1 tablet (500 mg total) by mouth 2 (two) times daily with a meal. (Patient not taking: Reported on 06/03/2017), Disp: 60 tablet, Rfl: 5   oxybutynin (DITROPAN XL) 10 MG 24 hr tablet, Take 1 tablet (10 mg total) by mouth at bedtime. (Patient not taking: Reported on 06/30/2023), Disp: 30 tablet, Rfl: 11   oxyCODONE-acetaminophen (PERCOCET/ROXICET) 5-325 MG tablet, Take 1 tablet by mouth every 8 (eight) hours as needed for severe pain. 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   Counseled:____6/30   Suppress @ 34wks:_____    Medical history non-contributory    Multiple sclerosis (HCC)    Pregnancy    Smoker 08/10/2013    PAST SURGICAL HISTORY: Past Surgical History:  Procedure Laterality Date   CESAREAN SECTION     CESAREAN SECTION N/A 02/25/2014   Procedure: REPEAT CESAREAN SECTION;   Surgeon: [name], MD;  Location: [facility];  Service: Obstetrics;  Laterality: N/A;   TONGUE SURGERY     due to accident as child    FAMILY HISTORY: Family History  Problem Relation Age of Onset   Hypertension Mother    Heart Problems Mother    Stroke Father    Heart Problems Father    Multiple sclerosis Father    Seizures Father    Hypertension Maternal Grandfather    Kidney disease Maternal Grandfather     SOCIAL HISTORY: Social History   Socioeconomic History   Marital status: Single    Spouse name: Not on file   Number of children: Not on file   Years of education: Not on file   Highest education level: Not on file  Occupational History   Not on file  Tobacco Use   Smoking status: Some Days    Current packs/day: 0.50    Types: Cigarettes   Smokeless tobacco: Never  Vaping Use   Vaping  status: Never Used  Substance and Sexual Activity   Alcohol use: Yes    Comment: occ.    Drug use: Yes    Frequency: 7.0 times per week    Types: Marijuana   Sexual activity: Yes    Birth control/protection: None  Other Topics Concern   Not on file  Social History Narrative   Not on file   Social Drivers of Health   Financial Resource Strain: Medium Risk (04/23/2022)   Received from [facility], [facility]   Overall Financial Resource Strain (CARDIA)    Difficulty of Paying Living Expenses: Somewhat hard  Food Insecurity: Food Insecurity Present (04/23/2022)   Received from [facility], [facility]   Hunger Vital Sign    Worried About Running Out of Food in the Last Year: Sometimes true    Ran Out of Food in the Last Year: Sometimes true  Transportation Needs: No Transportation Needs (04/23/2022)   Received from [employer], [facility]   PRAPARE - Transportation    Lack of Transportation (Medical): No    Lack of Transportation (Non-Medical): No  Physical Activity: Inactive (04/23/2022)   Received from [facility], [facility]   Exercise Vital  Sign    Days of Exercise per Week: 0 days    Minutes of Exercise per Session: 0 min  Stress: Stress Concern Present (04/23/2022)   Received from [facility], [facility] - Occupational Stress Questionnaire    Feeling of Stress : Rather much  Social Connections: Unknown (03/19/2022)   Received from [facility], [facility]   Social Network    Social Network: Not on file  Intimate Partner Violence: Unknown (04/28/2023)   Received from [facility]   HITS    Physically Hurt: Not on file    Insult or Talk Down To: Not on file    Threaten Physical Harm: Not on file    Scream or Curse: Not on file       PHYSICAL EXAM  Vitals:   06/30/23 1133 06/30/23 1139  BP: (!) 159/120 (!) 138/99  Pulse: 77 71  Weight: 239 lb (108.4 kg)   Height: 5\' 4"  (1.626 m)     Body mass index is 41.02 kg/m.  No results found.    General: The patient is well-developed and well-nourished and in no acute distress.    HEENT:  Head is [place]/AT.  Sclera are anicteric.    Skin: Extremities are without rash or  edema.  Neurologic Exam  Mental status: The patient is alert and oriented x 3 at the time of the examination. The patient has apparent normal recent and remote memory, with an apparently normal attention span and concentration ability.   Speech is normal.  Cranial nerves: Extraocular movements are full.  Strength and sensation was normal.  No dysarthria.   No obvious hearing deficits are noted.  Motor:  Muscle bulk is normal.   She has moderately increased tone of the left leg and minimally be increased tone in the right leg.  Strength was 4+/5 in the iliopsoas and ankle extension on the left  and 5/5 in other limbs   Sensory: She reports reduced sensation to touch in right arm but symmetric in leg.  Reduced vibation in left leg relative to right   Coordination: Cerebellar testing reveals good finger-nose-finger and slightly off   heel-to-shin..  Gait and station:   Station was normal.  Gait is mildly wide and tandem gait is  wide.   Slight left foot drop.  Romberg is negative.  Reflexes: Deep tendon reflexes are symmetric and normal bilaterally.      DIAGNOSTIC DATA (LABS, IMAGING, TESTING) - I reviewed patient records, labs, notes, testing and imaging myself where available.  Lab Results  Component Value Date   WBC 4.7 04/17/2022   HGB 14.7 04/17/2022   HCT 42.7 04/17/2022   MCV 96 04/17/2022   PLT 263 04/17/2022      Component Value Date/Time   NA 140 04/17/2022 0956   K 4.1 04/17/2022 0956   CL 103 04/17/2022 0956   CO2 22 04/17/2022 0956   GLUCOSE 75 04/17/2022 0956   GLUCOSE 81 06/02/2017 2315   BUN 8 04/17/2022 0956   CREATININE 0.71 04/17/2022 0956   CALCIUM 9.3 04/17/2022 0956   PROT 7.3 04/17/2022 0956   ALBUMIN 4.2 04/17/2022 0956   AST 15 04/17/2022 0956   ALT 13 04/17/2022 0956   ALKPHOS 64 04/17/2022 0956   BILITOT 0.4 04/17/2022 0956   GFRNONAA >60 06/02/2017 2315   GFRAA >60 06/02/2017 2315    Lab Results  Component Value Date   VITAMINB12 580 06/03/2017   Lab Results  Component Value Date   TSH 0.561 06/03/2017       ASSESSMENT AND PLAN  Multiple sclerosis (HCC)  High risk medication use  Gait disturbance  Numbness  Urinary urgency   Continue Kesimpta.   Check CD19/CD20, IgG/IgM).  Sometime in 2025 we will check an MRI and cervical spine. Continue Oxybutynin for bladder. Stay active and exercise as tolerated.  We discussed trying to lose weight. Return in 6 months or sooner if there are new or worsening neurologic symptoms.    Kambre Messner A. Epimenio Foot, MD, [facility] 06/30/2023, 11:43 AM Certified in Neurology, Clinical Neurophysiology, Sleep Medicine and Neuroimaging  [facility] [address] [place], [place] [address] [phone]

## 2023-07-03 ENCOUNTER — Emergency Department (HOSPITAL_COMMUNITY)
Admission: EM | Admit: 2023-07-03 | Discharge: 2023-07-03 | Discharge status: Against Medical Advice | Payer: Medicaid Other | Attending: Emergency Medicine | Admitting: Emergency Medicine

## 2023-07-03 ENCOUNTER — Ambulatory Visit (HOSPITAL_COMMUNITY)
Admission: EM | Admit: 2023-07-03 | Discharge: 2023-07-03 | Disposition: A | Discharge status: Home / Self Care | Payer: No Typology Code available for payment source | Attending: Emergency Medicine | Admitting: Emergency Medicine

## 2023-07-03 ENCOUNTER — Encounter (HOSPITAL_COMMUNITY): Payer: Self-pay | Admitting: *Deleted

## 2023-07-03 ENCOUNTER — Encounter (HOSPITAL_COMMUNITY): Payer: Self-pay

## 2023-07-03 ENCOUNTER — Other Ambulatory Visit: Payer: Self-pay

## 2023-07-03 ENCOUNTER — Emergency Department (HOSPITAL_COMMUNITY): Payer: Medicaid Other

## 2023-07-03 DIAGNOSIS — Z23 Encounter for immunization: Secondary | ICD-10-CM | POA: Diagnosis not present

## 2023-07-03 DIAGNOSIS — Z5321 Procedure and treatment not carried out due to patient leaving prior to being seen by health care provider: Secondary | ICD-10-CM | POA: Diagnosis not present

## 2023-07-03 DIAGNOSIS — S61210A Laceration without foreign body of right index finger without damage to nail, initial encounter: Secondary | ICD-10-CM | POA: Diagnosis not present

## 2023-07-03 DIAGNOSIS — W260XXA Contact with knife, initial encounter: Secondary | ICD-10-CM | POA: Insufficient documentation

## 2023-07-03 DIAGNOSIS — S6991XA Unspecified injury of right wrist, hand and finger(s), initial encounter: Secondary | ICD-10-CM | POA: Diagnosis present

## 2023-07-03 DIAGNOSIS — S61209A Unspecified open wound of unspecified finger without damage to nail, initial encounter: Secondary | ICD-10-CM | POA: Diagnosis not present

## 2023-07-03 DIAGNOSIS — Y990 Civilian activity done for income or pay: Secondary | ICD-10-CM | POA: Diagnosis not present

## 2023-07-03 LAB — IGG, IGA, IGM
IgA/Immunoglobulin A, Serum: 519 mg/dL — ABNORMAL HIGH (ref 87–352)
IgG (Immunoglobin G), Serum: 1222 mg/dL (ref 586–1602)
IgM (Immunoglobulin M), Srm: 36 mg/dL (ref 26–217)

## 2023-07-03 LAB — CD20 B CELLS
% CD19-B Cells: 0 % — ABNORMAL LOW (ref 4.6–22.1)
% CD20-B Cells: 0 % — ABNORMAL LOW (ref 5.0–22.3)

## 2023-07-03 MED ORDER — HYDROCODONE-ACETAMINOPHEN 5-325 MG PO TABS: 1.0000 | ORAL_TABLET | Freq: Four times a day (QID) | ORAL | 0 refills | Status: DC | PRN

## 2023-07-03 MED ORDER — HYDROCODONE-ACETAMINOPHEN 5-325 MG PO TABS
1.0000 | ORAL_TABLET | Freq: Once | ORAL | Status: AC
  Administered 2023-07-03: 1 via ORAL
  Filled 2023-07-03: qty 1

## 2023-07-03 MED ORDER — TETANUS-DIPHTH-ACELL PERTUSSIS 5-2.5-18.5 LF-MCG/0.5 IM SUSY
PREFILLED_SYRINGE | INTRAMUSCULAR | Status: AC
  Filled 2023-07-03: qty 0.5

## 2023-07-03 MED ORDER — TETANUS-DIPHTH-ACELL PERTUSSIS 5-2.5-18.5 LF-MCG/0.5 IM SUSY
0.5000 mL | PREFILLED_SYRINGE | Freq: Once | INTRAMUSCULAR | Status: AC
Start: 2023-07-03 — End: 2023-07-03
  Administered 2023-07-03: 0.5 mL via INTRAMUSCULAR

## 2023-07-03 MED ORDER — LIDOCAINE HCL (PF) 1 % IJ SOLN
INTRAMUSCULAR | Status: AC
  Filled 2023-07-03: qty 30

## 2023-07-03 NOTE — Discharge Instructions (Addendum)
Keep gauze and bandage on overnight Tomorrow you can remove the dressing, apply antibiotic ointment, and re-wrap. Change dressing at least once daily. It may take several weeks to months for the skin to heal up and thicken. It will be tender and raw in the next few weeks.  For pain use ibuprofen.  I have sent a couple Norco tablets to your pharmacy. Please note this is a narcotic medication. It has addictive properties and should be used sparingly. It will make you drowsy. Do not drink, drive, or make judgement decisions if you choose to take this medication.  We have updated your tetanus vaccine today

## 2023-07-03 NOTE — ED Triage Notes (Signed)
PT cut finger at work at 1145 AM. Pt left [facility] and came to [facility] due to long wait. PT reports she needs a tetanus vaccine.

## 2023-07-03 NOTE — ED Triage Notes (Signed)
Patient cut her right pointer finger tip with a knife accidentally at work. Unknown when tetanus shot was. Not on a blood thinner. Actively bleeding in triage.

## 2023-07-03 NOTE — ED Provider Triage Note (Addendum)
Emergency Medicine Provider Triage Evaluation Note  Sherri Woodward , a 37 y.o. female  was evaluated in triage.  Pt complains of finger laceration.  Cut the tip of her right pointer finger at work with a knife accidentally.  Appears to be actively oozing blood.  Not on a blood thinner.  Unsure of last tetanus.  Review of Systems  Positive: See above Negative: See above  Physical Exam  BP (!) 152/99   Pulse 98   Temp 98 F (36.7 C) (Oral)   Resp 19   Ht 5\' 4"  (1.626 m)   Wt 108.4 kg   SpO2 100%   BMI 41.02 kg/m  Gen:   Awake, no distress   Resp:  Normal effort  MSK:   Moves extremities without difficulty  Other:    Medical Decision Making  Medically screening exam initiated at 12:08 PM.  Appropriate orders placed.  EIMAAN HASLER was informed that the remainder of the evaluation will be completed by another provider, this initial triage assessment does not replace that evaluation, and the importance of remaining in the ED until their evaluation is complete.  Work up started   Gareth Eagle, PA-C 07/03/23 1209    Gareth Eagle, PA-C 07/03/23 1209

## 2023-07-03 NOTE — ED Provider Notes (Signed)
[facility]    CSN: [id number] Arrival date & time: 07/03/23  1652     History   Chief Complaint Chief Complaint  Patient presents with   Laceration    HPI Sherri Woodward is a 37 y.o. female.  Finger injury occurred this morning at work Cut right hand index finger on a slicer  Went to ED but left due to long wait time She had an xray done without abnormality. Was also given a dose of Norco. They wrapped finger with gauze and coban. Patient has left dressing on, reports intact for about 6 hours. 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   Counseled:____6/30   Suppress @ 34wks:_____    Medical history non-contributory    Multiple sclerosis (HCC)    Pregnancy    Smoker 08/10/2013    Patient Active Problem List   Diagnosis Date Noted   Multiple sclerosis (HCC) 10/29/2022   High risk medication use 10/29/2022   Gait disturbance 10/29/2022   Urinary urgency 10/29/2022   Numbness and tingling of both lower extremities 06/03/2017   Tobacco use 06/03/2017   Numbness 06/03/2017   Encounter for IUD insertion 04/20/2014   Postpartum care following cesarean delivery 03/31/2014   Post-operative state 03/04/2014   Previous cesarean delivery, delivered 02/25/2014   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   Marijuana use 08/11/2013   Previous cesarean section 08/10/2013   Smoker 08/10/2013    Past Surgical History:  Procedure Laterality Date   CESAREAN SECTION     CESAREAN SECTION N/A 02/25/2014   Procedure: REPEAT CESAREAN SECTION;  Surgeon: [name], MD;  Location: [facility];  Service: Obstetrics;  Laterality: N/A;   TONGUE SURGERY     due to accident as child    OB History     Gravida  2   Para  2   Term  2   Preterm      AB      Living  2      SAB      IAB      Ectopic      Multiple      Live Births  2            Home Medications    Prior to Admission medications   Medication  Sig Start Date End Date Taking? Authorizing Provider  amLODipine (NORVASC) 10 MG tablet Take by mouth. 03/27/22  Yes [provider]  HYDROcodone-acetaminophen (NORCO) 5-325 MG tablet Take 1 tablet by mouth every 6 (six) hours as needed for moderate pain (pain score 4-6). 07/03/23  Yes Joedy Eickhoff, PA-C  KESIMPTA 20 MG/0.4ML SOAJ Inject 0.4 mLs into the skin every 30 (thirty) days. Start this at week 4 after finishing loading dose prescription. 06/25/22   Sater, Pearletha Furl, MD    Family History Family History  Problem Relation Age of Onset   Hypertension Mother    Heart Problems Mother    Stroke Father    Heart Problems Father    Multiple sclerosis Father    Seizures Father    Hypertension Maternal Grandfather    Kidney disease Maternal Grandfather     Social History Social History   Tobacco Use   Smoking status: Some Days    Current packs/day: 0.50    Types: Cigarettes   Smokeless tobacco: Never  Vaping Use   Vaping status: Never Used  Substance Use Topics   Alcohol use: Yes  Comment: occ.    Drug use: Yes    Frequency: 7.0 times per week    Types: Marijuana     Allergies   Honey   Review of Systems Review of Systems Per HPI  Physical Exam Triage Vital Signs ED Triage Vitals  Encounter Vitals Group     BP 07/03/23 1744 (!) 143/90     Systolic BP Percentile --      Diastolic BP Percentile --      Pulse Rate 07/03/23 1744 71     Resp 07/03/23 1744 18     Temp 07/03/23 1744 98.5 F (36.9 C)     Temp src --      SpO2 07/03/23 1744 98 %     Weight --      Height --      Head Circumference --      Peak Flow --      Pain Score 07/03/23 1741 7     Pain Loc --      Pain Education --      Exclude from Growth Chart --    No data found.  Updated Vital Signs BP (!) 143/90   Pulse 71   Temp 98.5 F (36.9 C)   Resp 18   SpO2 98%   Physical Exam Vitals and nursing note reviewed.  Constitutional:      General: She is not in acute  distress.    Appearance: Normal appearance.  Cardiovascular:     Rate and Rhythm: Normal rate and regular rhythm.     Pulses: Normal pulses.     Heart sounds: Normal heart sounds.  Pulmonary:     Effort: Pulmonary effort is normal.     Breath sounds: Normal breath sounds.  Musculoskeletal:        General: Normal range of motion.  Skin:    General: Skin is warm and dry.     Findings: Wound present.     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 Lightly bleeding. There is a thick clot in place. Distal sensation intact. There is some nail damage. Can bend at DIP, PIP, MCP. Cannot assess cap refill due to avulsion  Neurological:     Mental Status: She is alert and oriented to person, place, and time.    UC Treatments / Results  Labs (all labs ordered are listed, but only abnormal results are displayed) Labs Reviewed - No data to display  EKG  Radiology DG Hand Complete Right Result Date: 07/03/2023 CLINICAL DATA:  Laceration to the right index finger. EXAM: RIGHT HAND - COMPLETE 3+ VIEW COMPARISON:  None Available. FINDINGS: No fracture or bone lesion. Joints are normally spaced and aligned. Soft tissue injury with amputation of superficial soft tissues of the index finger tip. No radiopaque foreign body. IMPRESSION: 1. No fracture, joint abnormality or radiopaque foreign body Electronically Signed   By: Amie Portland M.D.   On: 07/03/2023 16:16    Procedures Nerve Block  Date/Time: 07/03/2023 7:40 PM  Performed by: Marlow Baars, PA-C Authorized by: Marlow Baars, PA-C   Consent:    Consent obtained:  Verbal   Consent given by:  Patient   Risks, benefits, and alternatives were discussed: yes     Risks discussed:  Infection, nerve damage, swelling, bleeding, pain and unsuccessful block   Alternatives discussed:  Referral and alternative treatment Universal protocol:    Procedure explained and questions answered to patient or proxy's satisfaction: yes      Imaging studies  available: yes     Patient identity confirmed:  Verbally with patient Indications:    Indications:  Procedural anesthesia and pain relief Location:    Body area:  Upper extremity   Upper extremity nerve:  Metacarpal   Laterality:  Right Pre-procedure details:    Skin preparation:  Alcohol   Preparation: Patient was prepped and draped in usual sterile fashion   Procedure details:    Block needle gauge:  22 G   Anesthetic injected:  Lidocaine 1% w/o epi   Injection procedure:  Anatomic landmarks identified, incremental injection, negative aspiration for blood, anatomic landmarks palpated and introduced needle   Paresthesia:  None Post-procedure details:    Dressing:  Sterile dressing   Outcome:  Anesthesia achieved   Procedure completion:  Tolerated well, no immediate complications   Medications Ordered in UC Medications  Tdap (BOOSTRIX) injection 0.5 mL (0.5 mLs Intramuscular Given 07/03/23 1853)    Initial Impression / Assessment and Plan / UC Course  I have reviewed the triage vital signs and the nursing notes.  Pertinent labs & imaging results that were available during my care of the patient were reviewed by me and considered in my medical decision making (see chart for details).  Right hand index finger pad amputation Nerve block before cleaning thoroughly. Antibiotic ointment and clean dressing applied. Discussed wound care, healing process, signs of infection to watch for. Return and ED precautions Tetanus updated today Recommend ibu for pain. Sent 6 tablets norco to use with precautions. Also provided with hand specialist for follow up if desired. Patient agrees to plan  Final Clinical Impressions(s) / UC Diagnoses   Final diagnoses:  Avulsion of skin of finger, initial encounter     Discharge Instructions      Keep gauze and bandage on overnight Tomorrow you can remove the dressing, apply antibiotic ointment, and re-wrap. Change dressing at  least once daily. It may take several weeks to months for the skin to heal up and thicken. It will be tender and raw in the next few weeks.  For pain use ibuprofen.  I have sent a couple Norco tablets to your pharmacy. Please note this is a narcotic medication. It has addictive properties and should be used sparingly. It will make you drowsy. Do not drink, drive, or make judgement decisions if you choose to take this medication.  We have updated your tetanus vaccine today     ED Prescriptions     Medication Sig Dispense Auth. Provider   HYDROcodone-acetaminophen (NORCO) 5-325 MG tablet Take 1 tablet by mouth every 6 (six) hours as needed for moderate pain (pain score 4-6). 6 tablet Arlenne Kimbley, Lurena Joiner, PA-C      I have reviewed the PDMP during this encounter.   Marlow Baars, [place] 07/03/23 1945

## 2023-09-29 ENCOUNTER — Encounter: Payer: Self-pay | Admitting: Neurology

## 2023-09-29 DIAGNOSIS — G35 Multiple sclerosis: Secondary | ICD-10-CM

## 2023-09-29 MED ORDER — KESIMPTA 20 MG/0.4ML ~~LOC~~ SOAJ: 0.4000 mL | SUBCUTANEOUS | 11 refills | Status: DC

## 2023-10-01 ENCOUNTER — Other Ambulatory Visit: Payer: Self-pay

## 2023-10-01 DIAGNOSIS — G35 Multiple sclerosis: Secondary | ICD-10-CM

## 2023-10-01 MED ORDER — KESIMPTA 20 MG/0.4ML ~~LOC~~ SOAJ: 0.4000 mL | SUBCUTANEOUS | 11 refills | Status: AC

## 2023-11-26 ENCOUNTER — Telehealth: Payer: Self-pay | Admitting: Neurology

## 2023-11-26 NOTE — Telephone Encounter (Signed)
 Left detailed message on patient voicemail that Rx should be at pharmacy already with refills.

## 2023-11-26 NOTE — Telephone Encounter (Signed)
 Pt called needing a refill on her KESIMPTA  20 MG/0.4ML SOAJ and is needing it sent to the [facility]

## 2024-01-07 NOTE — Patient Instructions (Incomplete)

## 2024-01-07 NOTE — Progress Notes (Deleted)
 No chief complaint on file.   HISTORY OF PRESENT ILLNESS:  01/07/24 ALL:  Sherri Woodward is a 38 y.o. female here today for follow up for RRMS. She continues Kesimpta  and tolerating well. Labs have been stable. Last MRI brain showed 1 new lesion in right parietal region, cervical spine stable.   She reports   BP? Amlodipine    Oxybutynin ?  HISTORY (copied from Dr Duncan previous note)   Sherri Woodward, is a 38 y.o. woman with multiple sclerosis.   Update 12/16//2024 She started Kesimpta  06/2022 and is tolerating it well.    She feels neurologically stable and has no new exacerbation.        Gait slows down if she goes on a longer walk.  She feels her balance is off and the left knee often gives out on her.  .  She can go up stairs well but needs banister going down.   The left leg is mildly weaker than the right leg   The right arm is mildly weak and sometimes goes numb on her and sometimes hurts.     She has urinary urgency/frequency.   She has less nocturia on oxybutynin      Vision is doing well.   She falls asleep quickly.     She notes some fatigue.   She has no cognitive issues.  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        MS HISTORY She presented to [facility] 06/02/2017 at the age of 14 after the onset of paresthesias in her trunk and legs.  Symptoms developed over a couple days.   Additionally she had difficulty with weakness, right greater than left and had difficulty climbing stairs.  She had MRI of the brain and cervical spine that were consistent with multiple sclerosis.  She received several days of high-dose Solu-Medrol  with some improvement of symptoms.   She saw Dr. Milton and was placed on Tecfidera..   She was only on the medication for a few months.   She had stomach upset.   She stopped after a few months whrn her prescription ran out and has not  seen neurology or been on a DMT since 2019.    I saw her for the first time in late 2023 and she started Kesimpta  December 2023.     Her father had MS and became bedridden / wheelchair   Imaging: MRI of the brain 06/03/2017 showed scattered T2/FLAIR hyperintense foci in the periventricular white matter.  No infratentorial lesions in the brain   MRI of the cervical and thoracic spine 06/03/2017 shows T2 hyperintense foci in the posterior spinal cord adjacent to C2 and adjacent to C3-C4.   MRI of the brain 05/11/2022 shows Multiple T2/FLAIR hyperintense foci predominantly in the periventricular white matter of the cerebral hemispheres and at least 2 foci in the cervical spinal cord in a pattern consistent with chronic demyelinating plaque associated with multiple sclerosis. None of the foci enhanced or appear to be acute. However, 1 focus in the right parietal periventricular white matter was not present on the 06/03/2017 MRI.    MRI of the cervical spine showed There are multiple T2 hyperintense foci within the spinal cord. These are located centrally / posteriorly just below the cervicomedullary junction and adjacent to C1 and C2, posteriorly and towards the left adjacent to C3-C4  and towards the left adjacent to C4-C5.    REVIEW OF SYSTEMS: Out of a complete 14 system review of symptoms, the patient complains only of the following symptoms, and all other reviewed systems are negative.   ALLERGIES: Allergies  Allergen Reactions   Honey Diarrhea     HOME MEDICATIONS: Outpatient Medications Prior to Visit  Medication Sig Dispense Refill   amLODipine (NORVASC) 10 MG tablet Take by mouth.     HYDROcodone -acetaminophen  (NORCO) 5-325 MG tablet Take 1 tablet by mouth every 6 (six) hours as needed for moderate pain (pain score 4-6). 6 tablet 0   KESIMPTA  20 MG/0.4ML SOAJ Inject 0.4 mLs into the skin every 30 (thirty) days. Start this at week 4 after finishing loading dose prescription. 0.4 mL 11    No facility-administered medications prior to visit.     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   Counseled:____6/30   Suppress @ 34wks:_____    Medical history non-contributory    Multiple sclerosis (HCC)    Pregnancy    Smoker 08/10/2013     PAST SURGICAL HISTORY: Past Surgical History:  Procedure Laterality Date   CESAREAN SECTION     CESAREAN SECTION N/A 02/25/2014   Procedure: REPEAT CESAREAN SECTION;  Surgeon: Norleen Edsel GAILS, MD;  Location: [facility];  Service: Obstetrics;  Laterality: N/A;   TONGUE SURGERY     due to accident as child     FAMILY HISTORY: Family History  Problem Relation Age of Onset   Hypertension Mother    Heart Problems Mother    Stroke Father    Heart Problems Father    Multiple sclerosis Father    Seizures Father    Hypertension Maternal Grandfather    Kidney disease Maternal Grandfather      SOCIAL HISTORY: Social History   Socioeconomic History   Marital status: Single    Spouse name: Not on file   Number of children: Not on file   Years of education: Not on file   Highest education level: Not on file  Occupational History   Not on file  Tobacco Use   Smoking status: Some Days    Current packs/day: 0.50    Types: Cigarettes   Smokeless tobacco: Never  Vaping Use   Vaping status: Never Used  Substance and Sexual Activity   Alcohol use: Yes    Comment: occ.    Drug use: Yes    Frequency: 7.0 times per week    Types: Marijuana   Sexual activity: Yes    Birth control/protection: None  Other Topics Concern   Not on file  Social History Narrative   Not on file   Social Drivers of Health   Financial Resource Strain: Medium Risk (04/23/2022)   Received from [facility]   Overall Financial Resource Strain (CARDIA)    Difficulty of Paying Living Expenses: Somewhat hard  Food Insecurity: Low Risk  (07/25/2023)   Received from [facility]   Hunger Vital Sign     Within the past 12 months, you worried that your food would run out before you got money to buy more: Never true    Within the past 12 months, the food you bought just didn't last and you didn't have money to get more. : Never true  Transportation Needs: No Transportation Needs (07/25/2023)   Received from [facility]    In the past 12 months, has lack of reliable transportation kept  you from medical appointments, meetings, work or from getting things needed for daily living? : No  Physical Activity: Inactive (04/23/2022)   Received from [facility]   Exercise Vital Sign    On average, how many days per week do you engage in moderate to strenuous exercise (like a brisk walk)?: 0 days    On average, how many minutes do you engage in exercise at this level?: 0 min  Stress: Stress Concern Present (04/23/2022)   Received from [facility] Occupational [facility] - Occupational Stress Questionnaire    Feeling of Stress : Rather much  Social Connections: Unknown (03/19/2022)   Received from [facility]   Social Network    Social Network: Not on file  Intimate Partner Violence: Unknown (04/28/2023)   Received from [facility]   HITS    Physically Hurt: Not on file    Insult or Talk Down To: Not on file    Threaten Physical Harm: Not on file    Scream or Curse: Not on file     PHYSICAL EXAM  There were no vitals filed for this visit. There is no height or weight on file to calculate BMI.  Generalized: Well developed, in no acute distress  Cardiology: normal rate and rhythm, no murmur auscultated  Respiratory: clear to auscultation bilaterally    Neurological examination  Mentation: Alert oriented to time, place, history taking. Follows all commands speech and language fluent Cranial nerve II-XII: Pupils were equal round reactive to light. Extraocular movements were full, visual field were full on confrontational test. Facial sensation and  strength were normal. Uvula tongue midline. Head turning and shoulder shrug  were normal and symmetric. Motor: The motor testing reveals 5 over 5 strength of all 4 extremities. Good symmetric motor tone is noted throughout.  Sensory: Sensory testing is intact to soft touch on all 4 extremities. No evidence of extinction is noted.  Coordination: Cerebellar testing reveals good finger-nose-finger and heel-to-shin bilaterally.  Gait and station: Gait is normal. Tandem gait is normal. Romberg is negative. No drift is seen.  Reflexes: Deep tendon reflexes are symmetric and normal bilaterally.    DIAGNOSTIC DATA (LABS, IMAGING, TESTING) - I reviewed patient records, labs, notes, testing and imaging myself where available.  Lab Results  Component Value Date   WBC 4.7 04/17/2022   HGB 14.7 04/17/2022   HCT 42.7 04/17/2022   MCV 96 04/17/2022   PLT 263 04/17/2022      Component Value Date/Time   NA 140 04/17/2022 0956   K 4.1 04/17/2022 0956   CL 103 04/17/2022 0956   CO2 22 04/17/2022 0956   GLUCOSE 75 04/17/2022 0956   GLUCOSE 81 06/02/2017 2315   BUN 8 04/17/2022 0956   CREATININE 0.71 04/17/2022 0956   CALCIUM 9.3 04/17/2022 0956   PROT 7.3 04/17/2022 0956   ALBUMIN 4.2 04/17/2022 0956   AST 15 04/17/2022 0956   ALT 13 04/17/2022 0956   ALKPHOS 64 04/17/2022 0956   BILITOT 0.4 04/17/2022 0956   GFRNONAA >60 06/02/2017 2315   GFRAA >60 06/02/2017 2315   No results found for: CHOL, HDL, LDLCALC, LDLDIRECT, TRIG, CHOLHDL No results found for: YHAJ8R Lab Results  Component Value Date   VITAMINB12 580 06/03/2017   Lab Results  Component Value Date   TSH 0.561 06/03/2017        No data to display               No data  to display           ASSESSMENT AND PLAN  38 y.o. year old female  has a past medical history of HSV-2 seropositive (12/20/2013), Medical history non-contributory, Multiple sclerosis (HCC), Pregnancy, and Smoker (08/10/2013). here  with    No diagnosis found.  Lela JONELLE Battiest ***.  Healthy lifestyle habits encouraged. *** will follow up with PCP as directed. *** will return to see me in ***, sooner if needed. *** verbalizes understanding and agreement with this plan.   No orders of the defined types were placed in this encounter.    No orders of the defined types were placed in this encounter.    Greig Forbes, MSN, FNP-C 01/07/2024, 10:14 AM  [facility] [address], [address] [place], [place] [address] [phone]

## 2024-01-08 ENCOUNTER — Encounter: Payer: Self-pay | Admitting: Family Medicine

## 2024-01-08 ENCOUNTER — Ambulatory Visit: Payer: Medicaid Other | Admitting: Family Medicine

## 2024-01-08 DIAGNOSIS — R269 Unspecified abnormalities of gait and mobility: Secondary | ICD-10-CM

## 2024-01-08 DIAGNOSIS — G35 Multiple sclerosis: Secondary | ICD-10-CM

## 2024-01-08 DIAGNOSIS — Z79899 Other long term (current) drug therapy: Secondary | ICD-10-CM

## 2024-01-08 DIAGNOSIS — R3915 Urgency of urination: Secondary | ICD-10-CM

## 2024-05-13 ENCOUNTER — Telehealth: Payer: Self-pay | Admitting: Family Medicine

## 2024-05-13 NOTE — Telephone Encounter (Signed)
 LVM and sent mychart msg informing pt of need to reschedule 05/25/24 appt - NP out  If patient calls back you can offer a slot with Amy for 05/26/24

## 2024-05-25 ENCOUNTER — Ambulatory Visit: Admitting: Family Medicine

## 2024-05-26 ENCOUNTER — Ambulatory Visit: Admitting: Family Medicine

## 2024-05-26 ENCOUNTER — Encounter: Payer: Self-pay | Admitting: Family Medicine

## 2024-05-26 VITALS — BP 136/104 | HR 77 | Ht 64.0 in | Wt 221.5 lb

## 2024-05-26 DIAGNOSIS — Z79899 Other long term (current) drug therapy: Secondary | ICD-10-CM

## 2024-05-26 DIAGNOSIS — R5383 Other fatigue: Secondary | ICD-10-CM

## 2024-05-26 DIAGNOSIS — G35A Relapsing-remitting multiple sclerosis: Secondary | ICD-10-CM | POA: Diagnosis not present

## 2024-05-26 DIAGNOSIS — R531 Weakness: Secondary | ICD-10-CM

## 2024-05-26 DIAGNOSIS — R269 Unspecified abnormalities of gait and mobility: Secondary | ICD-10-CM | POA: Diagnosis not present

## 2024-05-26 NOTE — Patient Instructions (Signed)
 Below is our plan:  We will continue current treatment plan. Please keep a close eye on your BP at home. We will order MRI of brain and cervical spine for monitoring. I will update labs, today.   Please make sure you are staying well hydrated. I recommend 50-60 ounces daily. Well balanced diet and regular exercise encouraged. Consistent sleep schedule with 6-8 hours recommended.   Please continue follow up with care team as directed.   Follow up with Dr Vear in 6 months  You may receive a survey regarding today's visit. I encourage you to leave honest feed back as I do use this information to improve patient care. Thank you for seeing me today!

## 2024-05-26 NOTE — Progress Notes (Signed)
 Chief Complaint  Patient presents with   RM2/MS    Pt is here Alone. Pt states that things have been okay with her MS.     HISTORY OF PRESENT ILLNESS:  05/26/24 ALL:  Sherri Woodward is a 38 y.o. female here today for follow up for RRMS.  Labs were stable 06/2023. MRI brain and cervical spine 04/2022 concerning for new lesions. She has continued Kesimpta  since and tolerating well.   She reports doing well. No new or exacerbating symptoms. She has residual left sided weakness. Some difficulty with walking stairs. She uses bannister. Gait stable. No falls. No assistive device.   She denies pain. Mild fatigue at times. She is sleeping well. Mood is good. She stopped oxybutynin  as she did not feel it was needed. No new vision, bowel or bladder changes. She works full time for [employer]. BP is usually well managed. She took amlodipine about 45 minutes ago.    HISTORY (copied from Dr Duncan previous note)  Sherri Woodward, is a 38 y.o. woman with multiple sclerosis.   Update 12/16//2024 She started Kesimpta  06/2022 and is tolerating it well.    She feels neurologically stable and has no new exacerbation.        Gait slows down if she goes on a longer walk.  She feels her balance is off and the left knee often gives out on her.  .  She can go up stairs well but needs banister going down.   The left leg is mildly weaker than the right leg   The right arm is mildly weak and sometimes goes numb on her and sometimes hurts.     She has urinary urgency/frequency.   She has less nocturia on oxybutynin      Vision is doing well.   She falls asleep quickly.     She notes some fatigue.   She has no cognitive issues.  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        MS HISTORY She presented to [facility] 06/02/2017 at the age of 28 after the onset of paresthesias in her trunk and legs.   Symptoms developed over a couple days.   Additionally she had difficulty with weakness, right greater than left and had difficulty climbing stairs.  She had MRI of the brain and cervical spine that were consistent with multiple sclerosis.  She received several days of high-dose Solu-Medrol  with some improvement of symptoms.   She saw Dr. Milton and was placed on Tecfidera..   She was only on the medication for a few months.   She had stomach upset.   She stopped after a few months whrn her prescription ran out and has not seen neurology or been on a DMT since 2019.    I saw her for the first time in late 2023 and she started Kesimpta  December 2023.     Her father had MS and became bedridden / wheelchair   Imaging: MRI of the brain 06/03/2017 showed scattered T2/FLAIR hyperintense foci in the periventricular white matter.  No infratentorial lesions in the brain   MRI of the cervical and thoracic spine 06/03/2017 shows T2 hyperintense foci in the posterior spinal cord adjacent to C2 and adjacent to C3-C4.   MRI of the brain 05/11/2022 shows Multiple T2/FLAIR hyperintense foci predominantly in the periventricular white matter of  the cerebral hemispheres and at least 2 foci in the cervical spinal cord in a pattern consistent with chronic demyelinating plaque associated with multiple sclerosis. None of the foci enhanced or appear to be acute. However, 1 focus in the right parietal periventricular white matter was not present on the 06/03/2017 MRI.    MRI of the cervical spine showed There are multiple T2 hyperintense foci within the spinal cord. These are located centrally / posteriorly just below the cervicomedullary junction and adjacent to C1 and C2, posteriorly and towards the left adjacent to C3-C4 and towards the left adjacent to C4-C5.    REVIEW OF SYSTEMS: Out of a complete 14 system review of symptoms, the patient complains only of the following symptoms, left sided weakness, and all other  reviewed systems are negative.   ALLERGIES: Allergies  Allergen Reactions   Honey Diarrhea     HOME MEDICATIONS: Outpatient Medications Prior to Visit  Medication Sig Dispense Refill   amLODipine (NORVASC) 10 MG tablet Take by mouth.     KESIMPTA  20 MG/0.4ML SOAJ Inject 0.4 mLs into the skin every 30 (thirty) days. Start this at week 4 after finishing loading dose prescription. 0.4 mL 11   HYDROcodone -acetaminophen  (NORCO) 5-325 MG tablet Take 1 tablet by mouth every 6 (six) hours as needed for moderate pain (pain score 4-6). (Patient not taking: Reported on 05/26/2024) 6 tablet 0   No facility-administered medications prior to visit.     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   Counseled:____6/30   Suppress @ 34wks:_____    Medical history non-contributory    Multiple sclerosis    Pregnancy    Smoker 08/10/2013     PAST SURGICAL HISTORY: Past Surgical History:  Procedure Laterality Date   CESAREAN SECTION     CESAREAN SECTION N/A 02/25/2014   Procedure: REPEAT CESAREAN SECTION;  Surgeon: Norleen Edsel GAILS, MD;  Location: [facility];  Service: Obstetrics;  Laterality: N/A;   TONGUE SURGERY     due to accident as child     FAMILY HISTORY: Family History  Problem Relation Age of Onset   Hypertension Mother    Heart Problems Mother    Stroke Father    Heart Problems Father    Multiple sclerosis Father    Seizures Father    Hypertension Maternal Grandfather    Kidney disease Maternal Grandfather      SOCIAL HISTORY: Social History   Socioeconomic History   Marital status: Single    Spouse name: Not on file   Number of children: Not on file   Years of education: Not on file   Highest education level: Not on file  Occupational History   Not on file  Tobacco Use   Smoking status: Some Days    Current packs/day: 0.50    Types: Cigarettes   Smokeless tobacco: Never  Vaping Use   Vaping status: Never Used   Substance and Sexual Activity   Alcohol use: Yes    Comment: occ.    Drug use: Yes    Frequency: 7.0 times per week    Types: Marijuana   Sexual activity: Yes    Birth control/protection: None  Other Topics Concern   Not on file  Social History Narrative   Not on file   Social Drivers of Health   Financial Resource Strain: Medium Risk (04/23/2022)   Received from [facility]   Overall Financial Resource Strain (CARDIA)    Difficulty of  Paying Living Expenses: Somewhat hard  Food Insecurity: Low Risk  (07/25/2023)   Received from [facility]   Hunger Vital Sign    Within the past 12 months, you worried that your food would run out before you got money to buy more: Never true    Within the past 12 months, the food you bought just didn't last and you didn't have money to get more. : Never true  Transportation Needs: No Transportation Needs (07/25/2023)   Received from [facility]    In the past 12 months, has lack of reliable transportation kept you from medical appointments, meetings, work or from getting things needed for daily living? : No  Physical Activity: Inactive (04/23/2022)   Received from [facility]   Exercise Vital Sign    On average, how many days per week do you engage in moderate to strenuous exercise (like a brisk walk)?: 0 days    On average, how many minutes do you engage in exercise at this level?: 0 min  Stress: Stress Concern Present (04/23/2022)   Received from [facility] - Occupational Stress Questionnaire    Feeling of Stress : Rather much  Social Connections: Unknown (03/19/2022)   Received from [facility]   Social Network    Social Network: Not on file  Intimate Partner Violence: Unknown (04/28/2023)   Received from [facility]   HITS    Physically Hurt: Not on file    Insult or Talk Down To: Not on file    Threaten Physical Harm: Not on file    Scream or Curse: Not on file      PHYSICAL EXAM  Vitals:   05/26/24 0823  BP: (!) 136/104  Pulse: 77  SpO2: 99%  Weight: 221 lb 8 oz (100.5 kg)  Height: 5' 4 (1.626 m)   Body mass index is 38.02 kg/m.  Generalized: Well developed, in no acute distress  Cardiology: normal rate and rhythm, no murmur auscultated  Respiratory: clear to auscultation bilaterally    Neurological examination  Mentation: Alert oriented to time, place, history taking. Follows all commands speech and language fluent Cranial nerve II-XII: Pupils were equal round reactive to light. Extraocular movements were full, visual field were full on confrontational test. Facial sensation and strength were normal. Uvula tongue midline. Head turning and shoulder shrug  were normal and symmetric. Motor: The motor testing reveals 5 over 5 strength of all 4 extremities. Good symmetric motor tone is noted throughout.  Sensory: Sensory testing is intact to soft touch on all 4 extremities. No evidence of extinction is noted.   Gait and station: Gait is normal.    DIAGNOSTIC DATA (LABS, IMAGING, TESTING) - I reviewed patient records, labs, notes, testing and imaging myself where available.  Lab Results  Component Value Date   WBC 4.7 04/17/2022   HGB 14.7 04/17/2022   HCT 42.7 04/17/2022   MCV 96 04/17/2022   PLT 263 04/17/2022      Component Value Date/Time   NA 140 04/17/2022 0956   K 4.1 04/17/2022 0956   CL 103 04/17/2022 0956   CO2 22 04/17/2022 0956   GLUCOSE 75 04/17/2022 0956   GLUCOSE 81 06/02/2017 2315   BUN 8 04/17/2022 0956   CREATININE 0.71 04/17/2022 0956   CALCIUM 9.3 04/17/2022 0956   PROT 7.3 04/17/2022 0956   ALBUMIN 4.2 04/17/2022 0956   AST 15 04/17/2022 0956   ALT 13 04/17/2022 0956  ALKPHOS 64 04/17/2022 0956   BILITOT 0.4 04/17/2022 0956   GFRNONAA >60 06/02/2017 2315   GFRAA >60 06/02/2017 2315   No results found for: CHOL, HDL, LDLCALC, LDLDIRECT, TRIG, CHOLHDL No results found for:  HGBA1C Lab Results  Component Value Date   VITAMINB12 580 06/03/2017   Lab Results  Component Value Date   TSH 0.561 06/03/2017        No data to display               No data to display           ASSESSMENT AND PLAN  38 y.o. 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 here with    Relapsing remitting multiple sclerosis - Plan: CBC with Differential/Platelets, IgG, IgA, IgM, Vitamin D, 25-hydroxy, MR BRAIN W WO CONTRAST, MR CERVICAL SPINE W WO CONTRAST  High risk medication use  Gait disturbance  Other fatigue  Left-sided weakness  Sherri Woodward reports doing well. We will continue Kesimpta . I will update labs and imaging. I have encouraged her to monitor BP closely. Advised to follow up closely with PCP.  Healthy lifestyle habits encouraged. She will return to see Dr Vear in 6 months, sooner if needed. She verbalizes understanding and agreement with this plan.   Orders Placed This Encounter  Procedures   MR BRAIN W WO CONTRAST    Standing Status:   Future    Expiration Date:   05/26/2025    If indicated for the ordered procedure, I authorize the administration of contrast media per Radiology protocol:   Yes    What is the patient's sedation requirement?:   No Sedation    Does the patient have a pacemaker or implanted devices?:   No    Preferred imaging location?:   External   MR CERVICAL SPINE W WO CONTRAST    Standing Status:   Future    Expiration Date:   05/26/2025    If indicated for the ordered procedure, I authorize the administration of contrast media per Radiology protocol:   Yes    What is the patient's sedation requirement?:   No Sedation    Does the patient have a pacemaker or implanted devices?:   No    Preferred imaging location?:   External   CBC with Differential/Platelets   IgG, IgA, IgM   Vitamin D,  25-hydroxy     No orders of the defined types were placed in this encounter.    Greig Forbes, MSN, FNP-C 05/26/2024, 9:12 AM  [facility] [address], [address] [place], [place] [address] [phone]

## 2024-05-28 ENCOUNTER — Ambulatory Visit: Payer: Self-pay | Admitting: Family Medicine

## 2024-05-28 LAB — CBC WITH DIFFERENTIAL/PLATELET
Basophils Absolute: 0 x10E3/uL (ref 0.0–0.2)
Basos: 0 %
EOS (ABSOLUTE): 0.1 x10E3/uL (ref 0.0–0.4)
Eos: 2 %
Hematocrit: 47.1 % — ABNORMAL HIGH (ref 34.0–46.6)
Hemoglobin: 15.8 g/dL (ref 11.1–15.9)
Immature Grans (Abs): 0 x10E3/uL (ref 0.0–0.1)
Immature Granulocytes: 0 %
Lymphocytes Absolute: 1.2 x10E3/uL (ref 0.7–3.1)
Lymphs: 25 %
MCH: 34.6 pg — ABNORMAL HIGH (ref 26.6–33.0)
MCHC: 33.5 g/dL (ref 31.5–35.7)
MCV: 103 fL — ABNORMAL HIGH (ref 79–97)
Monocytes Absolute: 0.6 x10E3/uL (ref 0.1–0.9)
Monocytes: 13 %
Neutrophils Absolute: 2.7 x10E3/uL (ref 1.4–7.0)
Neutrophils: 60 %
Platelets: 262 x10E3/uL (ref 150–450)
RBC: 4.56 x10E6/uL (ref 3.77–5.28)
RDW: 13.5 % (ref 11.7–15.4)
WBC: 4.6 x10E3/uL (ref 3.4–10.8)

## 2024-05-28 LAB — IGG, IGA, IGM
IgG (Immunoglobin G), Serum: 1388 mg/dL (ref 586–1602)
IgM (Immunoglobulin M), Srm: 50 mg/dL (ref 26–217)
Immunoglobulin A, (IgA) QN, Serum: 617 mg/dL — AB (ref 87–352)

## 2024-05-28 LAB — VITAMIN D 25 HYDROXY (VIT D DEFICIENCY, FRACTURES): Vit D, 25-Hydroxy: 8.9 ng/mL — ABNORMAL LOW (ref 30.0–100.0)

## 2024-05-28 MED ORDER — VITAMIN D (ERGOCALCIFEROL) 1.25 MG (50000 UNIT) PO CAPS: 50000.0000 [IU] | ORAL_CAPSULE | ORAL | 1 refills | Status: AC

## 2024-06-09 ENCOUNTER — Encounter: Payer: Self-pay | Admitting: *Deleted

## 2024-06-09 NOTE — Telephone Encounter (Signed)
 Pt called stating that she received a call about  results . Informed Pt that she will get a callback

## 2024-07-02 ENCOUNTER — Inpatient Hospital Stay: Admission: RE | Admit: 2024-07-02 | Discharge: 2024-07-02 | Attending: Family Medicine | Admitting: Family Medicine

## 2024-07-02 DIAGNOSIS — G35A Relapsing-remitting multiple sclerosis: Secondary | ICD-10-CM

## 2024-07-02 MED ORDER — GADOPICLENOL 0.5 MMOL/ML IV SOLN
10.0000 mL | Freq: Once | INTRAVENOUS | Status: AC | PRN
  Administered 2024-07-02: 10 mL via INTRAVENOUS

## 2024-12-28 ENCOUNTER — Ambulatory Visit: Admitting: Neurology
